# Patient Record
Sex: Female | Born: 1965 | Race: Asian | Hispanic: No | Marital: Married
Health system: Western US, Academic
[De-identification: ages and names within clinical notes are randomized; demographics above are authoritative.]

## PROBLEM LIST (undated history)

## (undated) DIAGNOSIS — D1803 Hemangioma of intra-abdominal structures: Secondary | ICD-10-CM

## (undated) DIAGNOSIS — R1013 Epigastric pain: Secondary | ICD-10-CM

## (undated) DIAGNOSIS — K146 Glossodynia: Secondary | ICD-10-CM

## (undated) HISTORY — DX: Hemangioma of intra-abdominal structures: D18.03

## (undated) HISTORY — DX: Epigastric pain: R10.13

## (undated) HISTORY — DX: Glossodynia: K14.6

---

## 2005-09-26 HISTORY — PX: ESOPHAGOGASTRODUODENOSCOPY: SHX1529

## 2005-11-15 ENCOUNTER — Ambulatory Visit: Payer: Self-pay | Admitting: Internal Medicine

## 2005-11-15 ENCOUNTER — Emergency Department (HOSPITAL_COMMUNITY): Admission: EM | Admit: 2005-11-15 | Discharge: 2005-11-15 | Payer: Self-pay | Admitting: Emergency Medicine

## 2006-09-26 HISTORY — PX: ESOPHAGOGASTRODUODENOSCOPY: SHX1529

## 2006-12-27 ENCOUNTER — Ambulatory Visit (HOSPITAL_COMMUNITY): Admission: RE | Admit: 2006-12-27 | Discharge: 2006-12-27 | Payer: Self-pay | Admitting: Family Medicine

## 2007-01-05 ENCOUNTER — Ambulatory Visit (HOSPITAL_COMMUNITY): Admission: RE | Admit: 2007-01-05 | Discharge: 2007-01-05 | Payer: Self-pay | Admitting: Family Medicine

## 2007-01-09 ENCOUNTER — Ambulatory Visit (HOSPITAL_COMMUNITY): Admission: RE | Admit: 2007-01-09 | Discharge: 2007-01-09 | Payer: Self-pay | Admitting: Family Medicine

## 2007-01-16 ENCOUNTER — Ambulatory Visit (HOSPITAL_COMMUNITY): Admission: RE | Admit: 2007-01-16 | Discharge: 2007-01-16 | Payer: Self-pay | Admitting: Family Medicine

## 2007-01-23 ENCOUNTER — Ambulatory Visit: Payer: Self-pay | Admitting: Gastroenterology

## 2007-01-31 ENCOUNTER — Encounter (INDEPENDENT_AMBULATORY_CARE_PROVIDER_SITE_OTHER): Payer: Self-pay | Admitting: Specialist

## 2007-01-31 ENCOUNTER — Ambulatory Visit (HOSPITAL_COMMUNITY): Admission: RE | Admit: 2007-01-31 | Discharge: 2007-01-31 | Payer: Self-pay | Admitting: Gastroenterology

## 2007-01-31 ENCOUNTER — Ambulatory Visit: Payer: Self-pay | Admitting: Gastroenterology

## 2007-03-07 ENCOUNTER — Ambulatory Visit: Payer: Self-pay | Admitting: Gastroenterology

## 2007-05-02 ENCOUNTER — Ambulatory Visit: Payer: Self-pay | Admitting: Gastroenterology

## 2007-06-06 ENCOUNTER — Other Ambulatory Visit: Admission: RE | Admit: 2007-06-06 | Discharge: 2007-06-06 | Payer: Self-pay | Admitting: Obstetrics and Gynecology

## 2007-06-21 ENCOUNTER — Ambulatory Visit: Payer: Self-pay | Admitting: Gastroenterology

## 2008-05-20 ENCOUNTER — Ambulatory Visit (HOSPITAL_COMMUNITY): Admission: RE | Admit: 2008-05-20 | Discharge: 2008-05-20 | Payer: Self-pay | Admitting: Orthopaedic Surgery

## 2008-11-30 ENCOUNTER — Emergency Department (HOSPITAL_COMMUNITY): Admission: EM | Admit: 2008-11-30 | Discharge: 2008-11-30 | Payer: Self-pay | Admitting: Emergency Medicine

## 2008-12-01 ENCOUNTER — Emergency Department (HOSPITAL_COMMUNITY): Admission: EM | Admit: 2008-12-01 | Discharge: 2008-12-01 | Payer: Self-pay | Admitting: Emergency Medicine

## 2008-12-07 ENCOUNTER — Emergency Department (HOSPITAL_COMMUNITY): Admission: EM | Admit: 2008-12-07 | Discharge: 2008-12-07 | Payer: Self-pay | Admitting: Emergency Medicine

## 2008-12-10 ENCOUNTER — Ambulatory Visit (HOSPITAL_COMMUNITY): Admission: RE | Admit: 2008-12-10 | Discharge: 2008-12-10 | Payer: Self-pay | Admitting: Family Medicine

## 2009-01-05 ENCOUNTER — Ambulatory Visit (HOSPITAL_COMMUNITY): Admission: RE | Admit: 2009-01-05 | Discharge: 2009-01-05 | Payer: Self-pay | Admitting: Family Medicine

## 2009-03-16 ENCOUNTER — Ambulatory Visit (HOSPITAL_COMMUNITY): Admission: RE | Admit: 2009-03-16 | Discharge: 2009-03-16 | Payer: Self-pay | Admitting: Family Medicine

## 2009-09-16 ENCOUNTER — Other Ambulatory Visit: Admission: RE | Admit: 2009-09-16 | Discharge: 2009-09-16 | Payer: Self-pay | Admitting: Obstetrics and Gynecology

## 2009-09-21 ENCOUNTER — Ambulatory Visit (HOSPITAL_COMMUNITY): Admission: RE | Admit: 2009-09-21 | Discharge: 2009-09-21 | Payer: Self-pay | Admitting: Obstetrics and Gynecology

## 2010-02-08 ENCOUNTER — Encounter (INDEPENDENT_AMBULATORY_CARE_PROVIDER_SITE_OTHER): Payer: Self-pay | Admitting: *Deleted

## 2010-02-24 HISTORY — PX: ESOPHAGOGASTRODUODENOSCOPY: SHX1529

## 2010-02-24 HISTORY — PX: COLONOSCOPY: SHX174

## 2010-02-26 DIAGNOSIS — R142 Eructation: Secondary | ICD-10-CM

## 2010-02-26 DIAGNOSIS — R141 Gas pain: Secondary | ICD-10-CM

## 2010-02-26 DIAGNOSIS — K2970 Gastritis, unspecified, without bleeding: Secondary | ICD-10-CM

## 2010-02-26 DIAGNOSIS — K2990 Gastroduodenitis, unspecified, without bleeding: Secondary | ICD-10-CM

## 2010-02-26 DIAGNOSIS — R143 Flatulence: Secondary | ICD-10-CM

## 2010-03-02 ENCOUNTER — Ambulatory Visit: Payer: Self-pay | Admitting: Gastroenterology

## 2010-03-02 ENCOUNTER — Encounter: Payer: Self-pay | Admitting: Gastroenterology

## 2010-03-02 DIAGNOSIS — D1803 Hemangioma of intra-abdominal structures: Secondary | ICD-10-CM

## 2010-03-02 DIAGNOSIS — R1013 Epigastric pain: Secondary | ICD-10-CM

## 2010-03-02 DIAGNOSIS — K219 Gastro-esophageal reflux disease without esophagitis: Secondary | ICD-10-CM

## 2010-03-02 DIAGNOSIS — K921 Melena: Secondary | ICD-10-CM | POA: Insufficient documentation

## 2010-03-03 ENCOUNTER — Ambulatory Visit (HOSPITAL_COMMUNITY): Admission: RE | Admit: 2010-03-03 | Discharge: 2010-03-03 | Payer: Self-pay | Admitting: Gastroenterology

## 2010-03-05 ENCOUNTER — Encounter: Payer: Self-pay | Admitting: Gastroenterology

## 2010-03-15 ENCOUNTER — Ambulatory Visit: Payer: Self-pay | Admitting: Gastroenterology

## 2010-03-15 ENCOUNTER — Ambulatory Visit (HOSPITAL_COMMUNITY)
Admission: RE | Admit: 2010-03-15 | Discharge: 2010-03-15 | Payer: Self-pay | Source: Home / Self Care | Admitting: Gastroenterology

## 2010-03-18 ENCOUNTER — Encounter (INDEPENDENT_AMBULATORY_CARE_PROVIDER_SITE_OTHER): Payer: Self-pay

## 2010-05-07 ENCOUNTER — Encounter: Payer: Self-pay | Admitting: Gastroenterology

## 2010-06-03 ENCOUNTER — Ambulatory Visit: Payer: Self-pay | Admitting: Gastroenterology

## 2010-07-12 IMAGING — US US ABDOMEN COMPLETE
1 series · 14 of 25 positions shown · non-contrast
Comparison: 01/16/2007 ultrasound, CT 01/05/2007

CLINICAL DATA: Epigastric abdominal pain.

COMPLETE ABDOMINAL ULTRASOUND

[Series 1: us abdomen complete · 0.28mm/px · 14 of 67 slices shown]
[im 1/67]
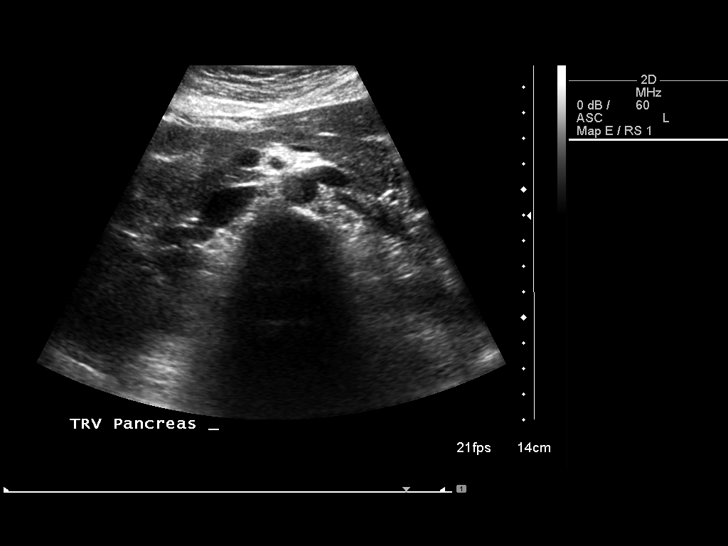
[im 6/67]
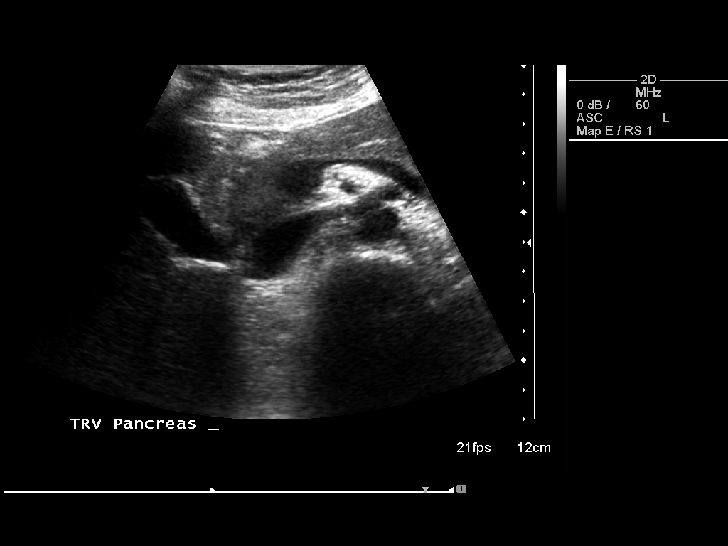
[im 12/67]
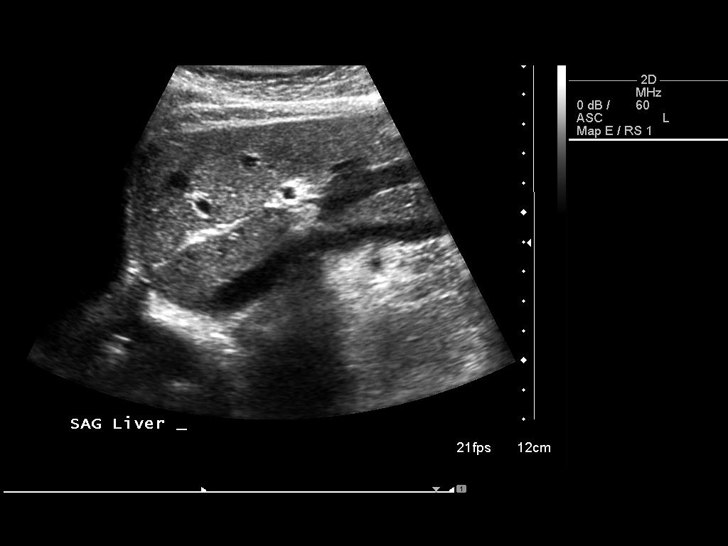
[im 17/67]
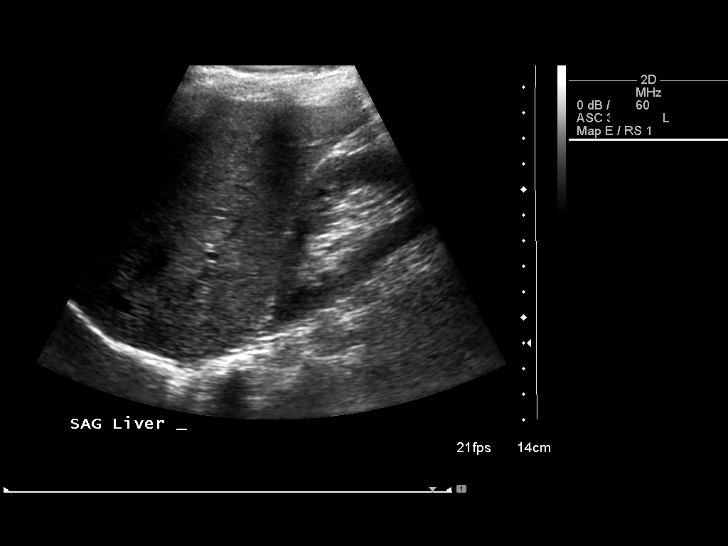
[im 23/67]
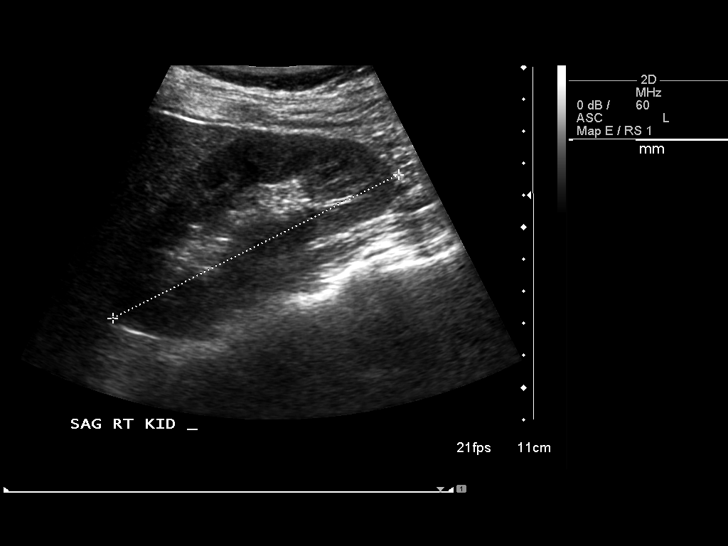
[im 25/67]
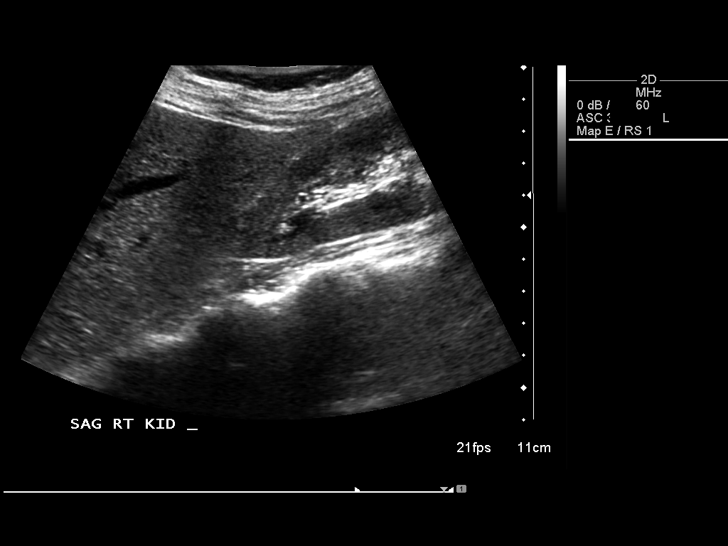
[im 31/67]
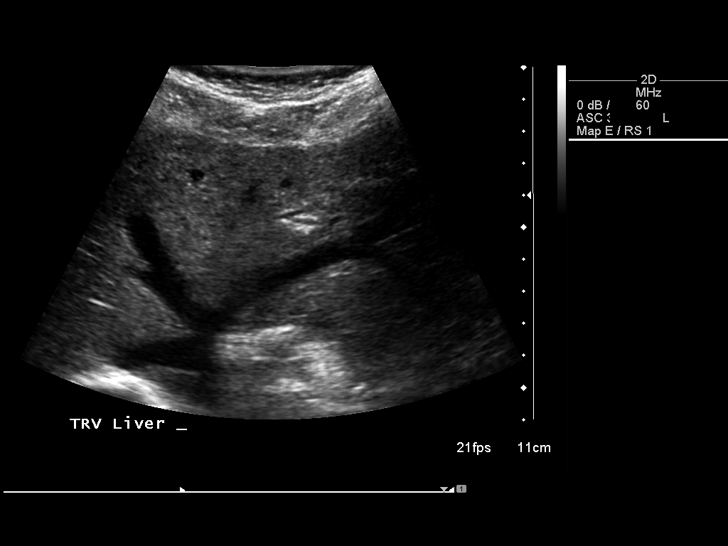
[im 36/67]
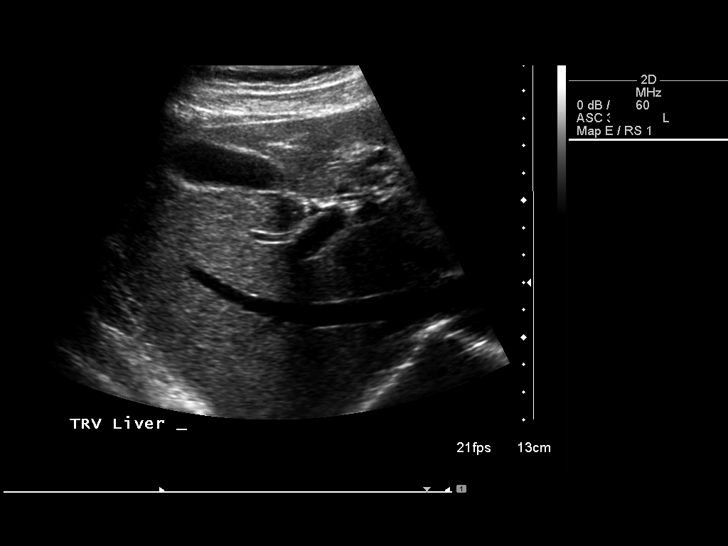
[im 42/67]
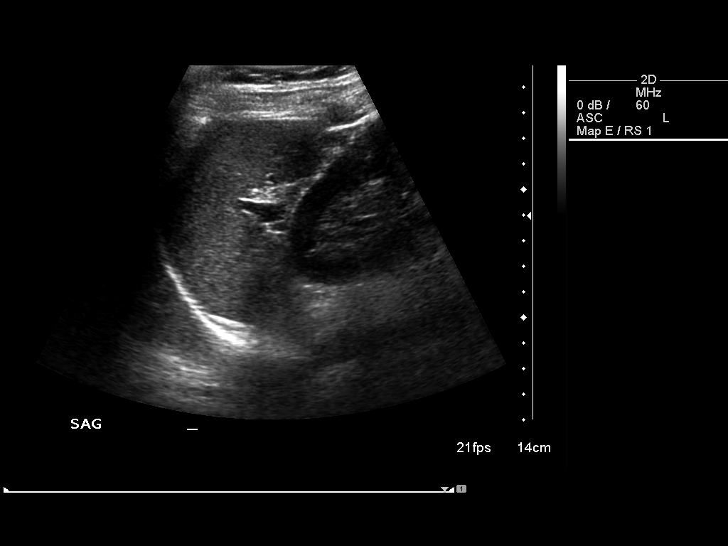
[im 45/67]
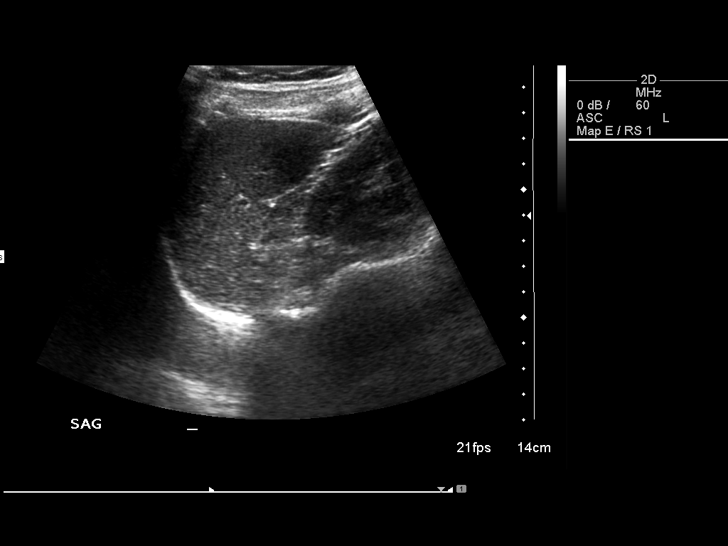
[im 50/67]
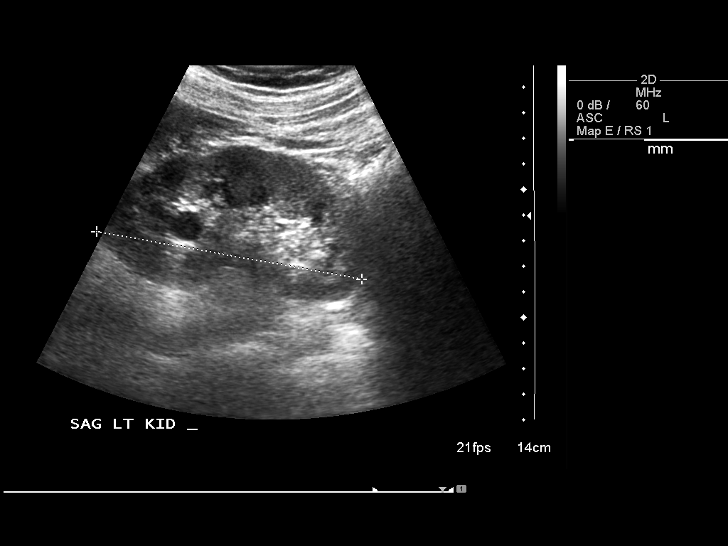
[im 56/67]
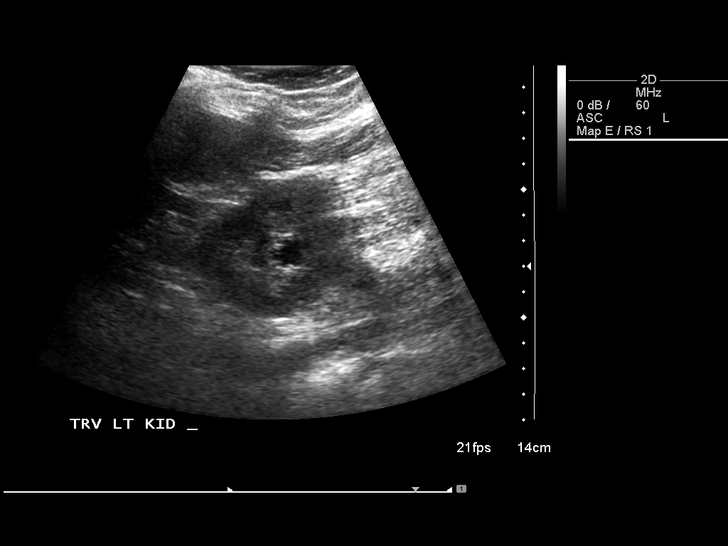
[im 61/67]
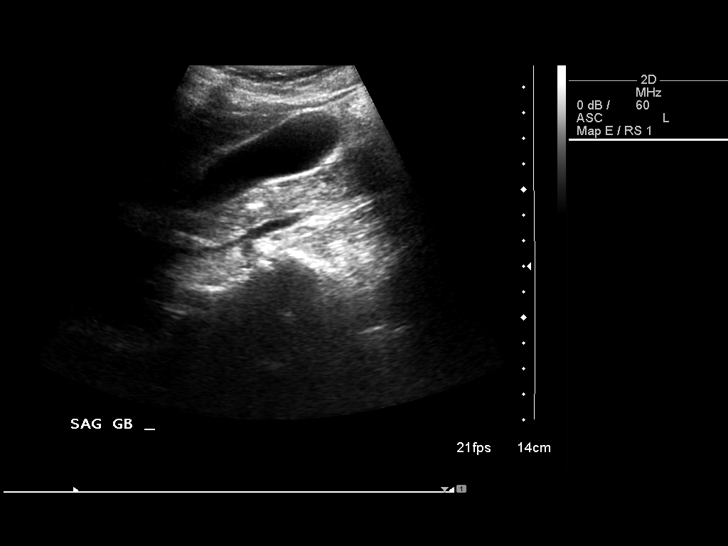
[im 67/67]
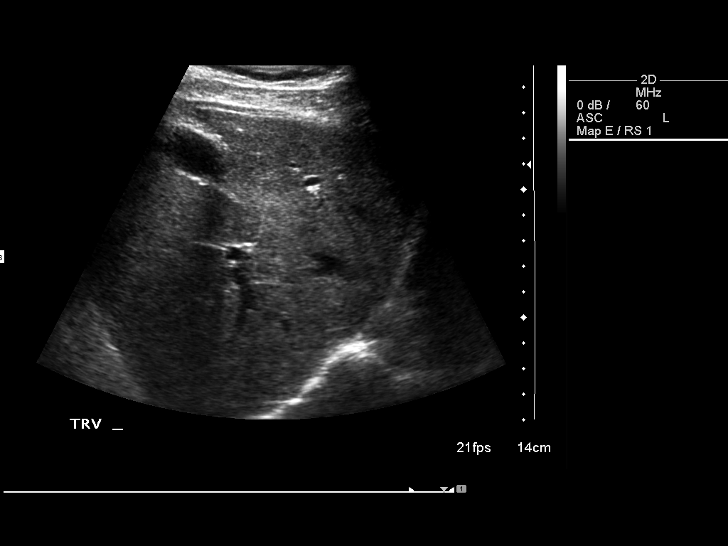

[14 of 25 positions shown; findings below may reference images not displayed]

FINDINGS: The gallbladder, common duct, liver, intrahepatic IVC, spleen,
right kidney, and abdominal aorta are normal.

A 1.3 x 1.3 x 1.2 cm left upper renal pole simple appearing cyst is
again noted.
IMPRESSION: No acute intra-abdominal finding.

## 2010-09-08 ENCOUNTER — Ambulatory Visit (HOSPITAL_COMMUNITY)
Admission: RE | Admit: 2010-09-08 | Discharge: 2010-09-08 | Payer: Self-pay | Source: Home / Self Care | Attending: Urology | Admitting: Urology

## 2010-10-28 NOTE — Letter (Signed)
Summary: TCS/EGD ORDER  TCS/EGD ORDER   Imported By: Ave Filter 03/02/2010 12:48:29  _____________________________________________________________________  External Attachment:    Type:   Image     Comment:   External Document

## 2010-10-28 NOTE — Assessment & Plan Note (Signed)
Summary: RECTAL BLEED RECURRENT/SS   Visit Type:  Initial Consult Referring Provider:  Fusco Primary Care Provider:  McGough  Chief Complaint:  rectal bleeding.  History of Present Illness: Barbara [name] is a pleasant 45 y/o [place] female who presents for further evaluation of recurrent rectal bleeding and epigastric pain. She c/o intermittent brbpr for quite some time but over the last one month it has been more frequent and increased volume. She describes at fresh blood. She has intermittent hard stools. Recently started on lactulose which helps when she takes it. She c/o ongoing epigastric pain, worse with meals and in am. She admits to eating at 10pm at night and then going to bed. She denies heartburn but c/o waking up with bad taste in her mouth and epigastric pain. No n/v. No dysphagia. She is really concerned about having cancer. She has h/o liver lesion found in 2008 and was supposed to have f/u MRI in 6 months but this was never done.   Current Medications (verified): 1)  No Meds  Allergies (verified): No Known Drug Allergies  Past History:  Past Medical History: EGD 5/08-->mild gastritis, no H.Pylori EGD, 2/07, emergent--fishbone removed from hypopharynx Nonspecific, 8X38mm homogeneously enhancing lesion within posterior dome of right liver on MRI 4/08  Past Surgical History: None  Family History: Father, cancer Brother, lung cancer Mother, healthy  Social History: Married. One son. Originally from [place]. In [place] since 1983. No tob/etoh. [employer].   Review of Systems General:  Denies fever, chills, sweats, anorexia, fatigue, weakness, and weight loss. Eyes:  Denies vision loss. ENT:  Denies nasal congestion, sore throat, hoarseness, and difficulty swallowing. CV:  Denies chest pains, angina, palpitations, dyspnea on exertion, and peripheral edema. Resp:  Denies dyspnea at rest, dyspnea with exercise, and cough. GI:  See HPI. GU:  Denies urinary burning and blood  in urine. MS:  Denies joint pain / LOM. Derm:  Denies rash and itching. Neuro:  Denies weakness, frequent headaches, memory loss, and confusion. Psych:  Denies depression and anxiety. Endo:  Denies unusual weight change. Heme:  Denies bruising and bleeding. Allergy:  Denies hives and rash.  Vital Signs:  Patient profile:   45 year old female Height:      62 inches Weight:      105 pounds BMI:     19.27 Temp:     98.9 degrees F oral Pulse rate:   84 / minute BP sitting:   110 / 78  (left arm) Cuff size:   regular  Vitals Entered By: Cloria Spring LPN (March 02, 1609 8:26 AM)  Physical Exam  General:  Well developed, well nourished, no acute distress. Head:  Normocephalic and atraumatic. Eyes:  sclera nonicteric Mouth:  Oropharyngeal mucosa moist, pink.  No lesions, erythema or exudate.    Neck:  Supple; no masses or thyromegaly. Lungs:  Clear throughout to auscultation. Heart:  Regular rate and rhythm; no murmurs, rubs,  or bruits. Abdomen:  Soft. Positive bs. Mild epigastric tenderness. No rebound or guarding. No HSM or masses. No abd bruit or hernia. Rectal:  deferred until time of colonoscopy.   Extremities:  No clubbing, cyanosis, edema or deformities noted. Neurologic:  Alert and  oriented x4;  grossly normal neurologically. Skin:  Intact without significant lesions or rashes. Cervical Nodes:  No significant cervical adenopathy. Psych:  Alert and cooperative. Normal mood and affect.  Impression & Recommendations:  Problem # 1:  HEMATOCHEZIA (ICD-578.1)  Chronic intermittent hematachezia. Ddx includes hemorrhoidal bleeding, polyps,  avms, malignancy. Colonoscopy to be performed in near future.  Risks, alternatives, and benefits including but not limited to the risk of reaction to medication, bleeding, infection, and perforation were addressed.  Patient voiced understanding and provided verbal consent.   Orders: Consultation Level III ([address])  Problem # 2:  EPIGASTRIC  PAIN, CHRONIC (ICD-789.06)  Chronic pp epigastric pain. Previously some improvement with PPI. Last EGD, nonspecific gastritis. Will resume PPI. Nexium 40mg  by mouth daily, #20 samples. Patient very persistent on having repeat EGD. EGD to be performed in near future.  Risks, alternatives, benefits including but not limited to risk of reaction to medications, bleeding, infection, and perforation addressed.  Patient voiced understanding and verbal consent obtained.   Orders: Consultation Level III ([address])  Problem # 3:  LIVER MASS (ICD-235.3)  Overdue for surveillance MRI liver with/without contrast.   Orders: Consultation Level III (09811) I would like to thank Dr. Sherwood Gambler for allowing [place] to take part in the care of this nice patient.  Appended Document: RECTAL BLEED RECURRENT/SS MAY 2011 13 LBS

## 2010-10-28 NOTE — Letter (Signed)
Summary: UROLOGY REFERRAL  UROLOGY REFERRAL   Imported By: Ave Filter 03/05/2010 09:07:53  _____________________________________________________________________  External Attachment:    Type:   Image     Comment:   External Document  Appended Document: UROLOGY REFERRAL Pt scheduled to see the Urologist 03/22/10@9 :30a.m.  Pt aware of appt.

## 2010-10-28 NOTE — Assessment & Plan Note (Signed)
Summary: GASTRITIS, HEMANGIOMA   Visit Type:  Follow-up Visit Primary Care Provider:  Regino Schultze, M.D.  Chief Complaint:  follow up- doing ok.  History of Present Illness: Feels better. Sometime foods causes it to feel hard. Sometimes she eats and it feels hard and full.   Current Medications (verified): 1)  Pantoprazole Sodium 40 Mg Tbec (Pantoprazole Sodium) .... Once Daily  Allergies (verified): No Known Drug Allergies  Past History:  Social History: Last updated: 06/03/2010 Married. One son: grade 12.  Originally from [place].  In [place] since 1983. No tob/etoh. [employer].   Past Medical History: 2011 RECTAL BLEEDING **[facility]-S[facility] DYSPEPSIA EGD 2011: [facility] HH, FUNDIC GLAND POLYP, EGD 5/08-->mild gastritis, no H.Pylori, EGD, 2/07, emergent--fishbone removed from hypopharynx SMALL HEPATIC HEMANGIOMA **MRI 2011, 2008: POS DOME RIGHT LIVER  Past Surgical History: Reviewed history from 03/02/2010 and no changes required. None  Social History: Married. One son: grade 12.  Originally from [place].  In [place] since 1983. No tob/etoh. [employer].   Vital Signs:  Patient profile:   45 year old female Height:      62 inches Weight:      106 pounds BMI:     19.46 Temp:     98.0 degrees F oral Pulse rate:   76 / minute BP sitting:   118 / 80  (left arm) Cuff size:   regular  Vitals Entered By: Hendricks Limes LPN (June 03, 2010 8:29 AM)  Physical Exam  General:  Well developed, well nourished, no acute distress. Head:  Normocephalic and atraumatic. Lungs:  Clear throughout to auscultation. Heart:  Regular rate and rhythm; no murmurs. Abdomen:  Soft. Positive bs. Mild epigastric tenderness. No rebound or guarding.   Impression & Recommendations:  Problem # 1:  GASTRITIS (ICD-535.50) Assessment Improved Continue Protonix. Eat 4-6 small meals daily. OPV IN 6 MOS.  CC: PCP  Appended Document: GASTRITIS, HEMANGIOMA 6 MONTH F/U OPV IS IN THE COMPUTER  Appended  Document: Orders Update    Clinical Lists Changes  Orders: Added new Service order of Est. Patient Level II ([address]) - Signed

## 2010-10-28 NOTE — Letter (Signed)
Summary: Patient Notice, Endo Biopsy Results  [facility]  [address]   [place], [place] [address]   Phone: [phone]  Fax: [phone]       March 18, 2010   Barbara [name] [address] [place], [place]  [address] 1966-07-22    Dear Ms. Irigoyen,  I am pleased to inform you that the biopsies taken during your recent endoscopic examination did not show any evidence of cancer upon pathologic examination.  Additional information/recommendations:    __Please continue to take your reflux medication..      Please call us if you are having persistent problems or have questions about your condition that have not been fully answered at this time.  Sincerely,    Hendricks Limes LPN  [facility] Ph: [phone]   Fax: [phone]

## 2010-10-28 NOTE — Letter (Signed)
Summary: referral note  referral note   Imported By: Rosine Beat 05/07/2010 13:26:56  _____________________________________________________________________  External Attachment:    Type:   Image     Comment:   External Document

## 2010-10-28 NOTE — Miscellaneous (Signed)
Summary: Orders Update  Clinical Lists Changes  Problems: Added new problem of SCREENING FOR UNSPECIFIED CONDITION (ICD-V82.9) Orders: Added new Test order of T-Creatinine Blood ([id number]) - Signed 

## 2010-10-28 NOTE — Letter (Signed)
Summary: Scheduled Appointment  [facility]  [address]   [place], [place] [address]   Phone: [phone]  Fax: [phone]    Feb 08, 2010   Dear: Barbara [name]            DOB: 01-22-1966    I have been instructed to schedule you an appointment in our office.  Your appointment is as follows:   Date:    March 02, 2010    Time:    8:30 AM    Please be here 15 minutes early.   Provider:   Unice Bailey    Please contact the office if you need to reschedule this appointment for a more convenient time.   Thank you,    Diana Eves       [facility] Ph: [phone]   Fax: [phone]

## 2010-10-28 NOTE — Letter (Signed)
Summary: MRI ORDER  MRI ORDER   Imported By: Ave Filter 03/02/2010 12:49:17  _____________________________________________________________________  External Attachment:    Type:   Image     Comment:   External Document

## 2010-12-02 ENCOUNTER — Encounter (INDEPENDENT_AMBULATORY_CARE_PROVIDER_SITE_OTHER): Payer: Self-pay | Admitting: *Deleted

## 2010-12-07 LAB — CREATININE, SERUM
Creatinine, Ser: 0.59 mg/dL (ref 0.4–1.2)
GFR calc Af Amer: >60 mL/min (ref >60)

## 2010-12-07 NOTE — Letter (Signed)
Summary: Recall Office Visit  [facility]  [address]   [place], [place] [address]   Phone: [phone]  Fax: [phone]      December 02, 2010   Barbara [name] [address] [place], [place]  [address] 03-19-1966   Dear Ms. Kitts,   According to our records, it is time for you to schedule a follow-up office visit with [place].   At your convenience, please call [phone] to schedule an office visit. If you have any questions, concerns, or feel that this letter is in error, we would appreciate your call.   Sincerely,    Diana Eves  [facility] Ph: [phone]   Fax: [phone]

## 2011-02-08 NOTE — Assessment & Plan Note (Signed)
NAMEMarland Kitchen  [name], Barbara [name]                    CHART#:  [id number]   DATE:  06/21/2007                       DOB:  12-27-1965   REFERRING PHYSICIAN:  Karleen Hampshire, MD.   PROBLEM LIST:  1. 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  2. Mild chronic gastritis.   SUBJECTIVE:  Barbara [name] is a 45 year old female who presents in a return  patient visit. She states that things are much better. the Digestive  Advantage made her stomach hurt after 3 pills and so she stopped it. She  denies any nausea, vomiting or abdominal pain since stopping the  Digestive Advantage. Her appetite is okay. She easily gets tired. She  works 13 hours a day and gets 6 to 7 hours of sleep. She has a 14-year-  old child she describes as a good boy. She still feels like her abdomen  looks full, but it does not hurt. She has been avoiding milk.   MEDICATIONS:  Aciphex 20 mg daily.   PHYSICAL EXAMINATION:  VITAL SIGNS: Weight is 99.5 pounds, height 5 feet  2 inches (BMI 18.1, underweight. Weight stable since August of 2008).  Temperature 97.8, blood pressure 100/70, pulse 72.GENERAL: In general  she is in no apparent distress.LUNGS: The lungs are clear to  auscultation bilaterally.CARDIOVASCULAR: Her exam is a regular rhythm,  no murmur, normal S1 and S2.ABDOMEN: Bowel sounds are present, soft,  nontender, nondistended. No rebound or guarding.   LABORATORY DATA:  Labs from April of 2008: Total bilirubin 1.5, indirect  1.3, direct is 0.2. Normal GGT, AST and ALT.   ASSESSMENT:  Barbara [name] is a 45 year old female who presents as a return  patient visit. Her abdominal pain is now resolved on Aciphex. She also  had an elevated bilirubin which is predominantly indirect. 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 I explained to her that that condition is not  clinically significant.   Thank you for allowing me to see Barbara [name] in consultation. My  recommendations follow.    RECOMMENDATIONS:  1. She should continue the Aciphex until her refills run out. She may      try going without it, but if      her symptoms persist, then she would return to using the Aciphex.  2. She may follow up to see me as needed.       Kassie Mends, M.D.  Electronically Signed     SM/MEDQ  D:  06/21/2007  T:  06/21/2007  Job:  [id number]   cc:   Kirk Ruths, M.D.

## 2011-02-08 NOTE — Assessment & Plan Note (Signed)
NAME:  KIARRAH, RAUSCH                    CHART#:  [id number]   DATE:  05/02/2007                       DOB:  May 19, 1966   REFERRING PHYSICIAN:  Assunta Found.   PROBLEM LIST:  1. 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  2. Mild chronic gastritis.   SUBJECTIVE:  Ms. Boys is a 45 year old female who had improvement in  her bloating with the addition of Align.  She was unable to find it in  the store and is currently requesting additional pills.  She is still  taking the Aciphex and sometimes has pain and sometimes not.  She is not  really feeling full as much.  If she does eat something, occasionally  she will feel bloated.  In the daytime she does not feel the bloating as  much, it is usually when she wakes up in the morning.   MEDICATIONS:  Aciphex daily.   OBJECTIVE:  Weight 99 pounds (down 3 pounds since June of 2008), height  5 feet 2 inches,  BMI: 18.1 (underweight), temperature 98.5, blood  pressure 100/70, pulse 72.  GENERAL:  She is in no apparent distress, alert and oriented x4.LUNGS:  Clear to auscultation bilaterally.CARDIOVASCULAR EXAM:  A regular  rhythm, no murmur, normal S1 and S2.ABDOMEN:  Bowel sounds are present,  soft, nontender, nondistended, no rebound or guarding.   ASSESSMENT:  Ms. 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  The differential diagnosis includes lactose intolerance.  She  could have bacterial overgrowth.   Thank you for allowing me to see Ms. Kochanowski in consultation.  My  recommendations follow.   RECOMMENDATIONS:  1. She is asked to follow a lactose-free diet.  She may also take      Digestive Advantage Lactose Intolerance 1 or 2 daily for      inadvertent consumption of dairy products.  2. She is to continue the Aciphex once every day or every other day to      see if her abdominal pain is still controlled.  The Aciphex has an      expensive co-pay.  She is given an Aciphex co-  pay reimbursement card.  3. She has a return patient visit to see me in 6 weeks.       Kassie Mends, M.D.  Electronically Signed     SM/MEDQ  D:  05/03/2007  T:  05/04/2007  Job:  [id number]   cc:   Corrie Mckusick, M.D.

## 2011-02-08 NOTE — Assessment & Plan Note (Signed)
NAMESINDHU, Barbara [name]                    CHART#:  [id number]   DATE:  03/07/2007                       DOB:  August 23, 1966   PROBLEM LIST:  1. Abdominal pain and bloating, likely secondary to functional gut      disorder.  2. Mild chronic gastritis.  3. Hemangioma, Right hepatic lobe   SUBJECTIVE:  Ms. Barbara [name] is a 45 year old female who presents as a return  patient visit.  She reports feeling better, but not 100%.  She still  has a feeling of fullness.  She does not feel sick.  She has a normal  bowel movement.  She occasionally has hard stool.  She denies any watery  stool.  She states that after she eats it feels big in her belly.  It is  usually worse in the morning.  She gets up and walks around, and does  not feel as bad.  She denies chewing any gum or drinking any soda.  She  states she just drinks water.  She drinks a cup of regular milk every  morning.  She has been doing that for a long time.  She has some mild  abdominal pain.   MEDICATIONS:  Aciphex 20 mg daily.   OBJECTIVE:  Weight 102 pounds (down 2 pounds since her last visit).  Height 5 feet 2 inches.  BMI 18.7 (slightly underweight).  Temperature  98.6.  Blood pressure 100/78.  Pulse 80.  GENERAL:  She is in no apparent distress.  Alert and oriented x4.  LUNGS:  Clear to auscultation bilaterally.CARDIOVASCULAR EXAM:  Regular  rhythm.  No murmur.  Normal S1 and S2.ABDOMEN:  Bowel sounds are  present.  Soft, non-tender, and non-distended.  No rebound or guarding.   ASSESSMENT:  Ms. Barbara [name] is a 45 year old female with a complaint of  persistent bloating.  The differential diagnosis includes lactose  intolerance, bacterial overgrowth, and functional gut disorder.   Thank you for allowing me to see Ms. Barbara [name] in consultation.  My  recommendations to follow.   RECOMMENDATIONS:  1. Ms. Barbara [name] was asked to cut out drinking a cup of milk every morning      for 1 week.  If her abdominal discomfort is not improved, then she  is to begin to take Align.  She is to take that once a day for 3      weeks.  She is to call me on April 05, 2007 and let me know if her      symptoms are not improved.  If her symptoms are not improved, then      she will get a gastric emptying study.  She was given her      instructions for the management of her bloating in writing.  2. She has a return patient visit to see me in 6 weeks.  3. She should continue the Aciphex daily.       Kassie Mends, M.D.  Electronically Signed     SM/MEDQ  D:  03/08/2007  T:  03/08/2007  Job:  [id number]   cc:   Corrie Mckusick, M.D.

## 2011-02-11 NOTE — Op Note (Signed)
Barbara [name], Barbara [name]                   ACCOUNT NO.:  [id number]   MEDICAL RECORD NO.:  [id number]          PATIENT TYPE:  EMS   LOCATION:  ED                            FACILITY:  [facility]   PHYSICIAN:  R. Roetta Sessions, M.D. DATE OF BIRTH:  [birth year]   DATE OF PROCEDURE:  11/15/2005  DATE OF DISCHARGE:                                 OPERATIVE REPORT   PROCEDURE:  Emergency esophagogastroduodenoscopy with removal of foreign  body (fishbone).   ENDOSCOPIST:  Gerrit Friends. Rourk, M.D.   INDICATIONS FOR PROCEDURE:  The patient is a 45 year old [place] female,  resident of [place] who ate some fish last time evening and felt a fish  bone caught in the base of her throat.  She has had pain on swallowing ever  since.  She presented to the emergency department and saw Dr. Gertie Fey.  Plain  films reportedly were negative.  She called me, I have seen [name] and  offered her an emergent EGD.  The potential risks, benefits, and  alternatives have been reviewed, questions answered.  She is agreeable.  Please see documentation in the medical record.   PROCEDURE NOTE:  O2 saturation, blood pressure, pulse and respirations were  monitored throughout the entire procedure.   CONSCIOUS SEDATION:  IV Versed and Demerol in incremental doses.  Cetacaine  spray for topical oropharyngeal anesthesia.   INSTRUMENT:  Olympus video chip system.   FINDINGS:  Examination of the hypopharynx revealed a 2 cm long, nearly  transparent fishbone behind the epiglottis lodged in the mucosa.  Please see  photos. This was grasped with the tripod and removed. Subsequently we went  back and looked around and did not see any further foreign body.  Examination of the esophagus revealed no mucosal abnormalities.  The EG  junction was easily traversed.   STOMACH:  The gastric cavity was empty.  It insufflated well with air.  A  thorough examination of the gastric mucosa including a retroflex view of the  proximal stomach and  esophagogastric junction demonstrated only a small  hiatal hernia.  The pylorus was patent and easily traversed. Examination of  the bulb and second portion revealed no abnormalities.   The patient tolerated the brief procedure very well and was reacted in  endoscopy.   IMPRESSION:  1.  Foreign body (fishbone) removed from hypopharynx as described above.  2.  Otherwise normal hypopharynx, normal esophagus and small hiatal hernia.  3.  Otherwise normal stomach and D1-D2.   RECOMMENDATIONS:  Chloraseptic p.r.n. for transient sore throat that she may  experience.      Jonathon Bellows, M.D.  Electronically Signed     RMR/MEDQ  D:  11/15/2005  T:  11/16/2005  Job:  [id number]   cc:   Nickola Major, M.D.

## 2011-02-11 NOTE — Op Note (Signed)
Barbara [name], Barbara [name]                   ACCOUNT NO.:  [id number]   MEDICAL RECORD NO.:  [id number]          PATIENT TYPE:  AMB   LOCATION:  DAY                           FACILITY:  [facility]   PHYSICIAN:  Kassie Mends, M.D.      DATE OF BIRTH:  30-Dec-1965   DATE OF PROCEDURE:  01/31/2007  DATE OF DISCHARGE:                               OPERATIVE REPORT   PROCEDURE:  Esophagogastroduodenoscopy with cold forceps biopsy.   INDICATIONS FOR EXAM:  Barbara [name] is a 45 year old female who presented  with abdominal pain, weight loss, and bloating.  Her EGD is being  performed to evaluate for abdominal pain and weight loss.   FINDINGS:  1. Patchy erythema in the antrum.  Biopsies obtained to evaluate for      Helicobacter pylori gastritis or eosinophilic gastritis.  2. Normal esophagus, without evidence of Barrett's, erosion, or      ulceration.  3. Normal duodenal bulb and second portion of the duodenum.   RECOMMENDATIONS:  1. We will call Barbara [name] with her biopsy report.  2. Continue AcipHex.  Prescription given for AcipHex 20 mg daily, #30,      refill x3.  3. Resume previous diet, but avoid gastric irritants.  She is given      information on gastritis.  4. Avoid aspirin, NSAIDs, or anticoagulation for 5 days.  5. Follow up with Dr. Cira Servant in one month.   PROCEDURE TECHNIQUE:  Physical exams were performed, and informed  consent was obtained from the patient after explaining the benefits,  risks, and alternatives to the procedure.  The patient was connected to  the monitor and placed in the left lateral position.  Continuous oxygen  was provided by nasal cannula, and IV medicine administered through an  indwelling cannula.  After administration of sedation, the patient's  esophagus was intubated, and the scope was advanced under direct  visualization to the second portion of the duodenum.  The scope was  subsequently removed while carefully examining the color, texture,  anatomy, and  integrity of the mucosa on the way out.  The patient was  recovered in the endoscopy suite and discharged home in satisfactory  condition.      Kassie Mends, M.D.  Electronically Signed    SM/MEDQ  D:  01/31/2007  T:  01/31/2007  Job:  [id number]   cc:   Corrie Mckusick, M.D.  Fax: [phone]

## 2011-02-11 NOTE — Consult Note (Signed)
NAMEMOXIE, KALIL                   ACCOUNT NO.:  [id number]   MEDICAL RECORD NO.:  [id number]          PATIENT TYPE:  AMB   LOCATION:                                FACILITY:  [facility]   PHYSICIAN:  Kassie Mends, M.D.      DATE OF BIRTH:  23-Jun-1966   DATE OF CONSULTATION:  01/23/2007  DATE OF DISCHARGE:                                 CONSULTATION   REFERRING PHYSICIAN:  Corrie Mckusick, M.D.   REASON FOR CONSULTATION:  Abdominal pain, weight loss, and bloating.   HISTORY OF PRESENT ILLNESS:  Ms. Lacap is a 45 year old female who began  to have abdominal pain and bloating on April 2.  She noticed it when she  was eating shrimp.  She saw Dr. Regino Schultze and she was placed on Nexium.  Even with Nexium she continues to have bloating and fullness in her  upper abdomen.  She saw Dr. Phillips Odor and he put her on AcipHex.  She  describes it as a fullness all the time.  She is sometimes nauseated.  She also describes it as feeling like throwing up.  She also had some  black, tarry stools last year.  AcipHex has made her feel better but she  still has persistent pain.  She has also lost 4 pounds since the  beginning of April.  She denies any use of aspirin, ibuprofen, Aleve,  Goody Powders or BCs.  She is scared she might have cancer.  She has had  a workup by her family physician, which has included a CT scan and an  MRI and lab evaluation.  When she had her  CT scan, she reports feeling  big.  When she had the  MRI, she also felt big.  Today she feels big as  well.  She still remains uncomfortable.  She denies any bright red blood  from her rectum.  She denied any vomiting or problems swallowing.  She  has a bowel movement every day.  She denies any diarrhea.   PAST MEDICAL HISTORY:  None.   PAST SURGICAL HISTORY:  None.   ALLERGIES:  No known drug allergies.   MEDICATIONS:  AcipHex 20 mg daily.   FAMILY HISTORY:  Her father had cancer but she is not sure what type.  She reports a brother had  lung cancer.  Her mother is healthy.   SOCIAL HISTORY:  She is married and has one son.  They plan to travel  back to [place].  She is employed at [employer].  She is originally  [place].  She has been in the [place] since  1983.  She has lived in [place] as well as the [place] area.  She  denies any tobacco or alcohol use.   REVIEW OF SYSTEMS:  Per the HPI.  Otherwise, all systems are negative.   PHYSICAL EXAMINATION:  VITAL SIGNS:  Weight 104 pounds, height 5 feet 2  inches, temperature 98, blood pressure 110/70, pulse 64.  GENERAL:  She is in no apparent distress, alert and oriented  x4.  HEENT:  Atraumatic, normocephalic.  Pupils equal and react to light.  Mouth:  No  oral lesions.  Posterior pharynx without erythema or exudate.  NECK:  Full range of motion.  No lymphadenopathy.  LUNGS:  Clear to  auscultation bilaterally.  CARDIOVASCULAR:  A regular rhythm, no murmur,  normal S1 and S2.  ABDOMEN:  Bowel sounds present, soft, mild tenderness  to palpation in the epigastrium, epigastrium somewhat firm, no palpable  masses, no hepatosplenomegaly, abdominal bruits, or pulsatile masses.  EXTREMITIES:  Without cyanosis, clubbing or edema.  NEUROLOGIC:  No  focal neurologic deficits:   LABORATORY DATA:  April 2,2008:  Hemoglobin 15.2, platelets 200.  Cholesterol 238.  Hepatitis A antibody IgM negative.  Hepatitis C  antibody negative.  Labs from April17,2008:  Total bilirubin 1.5,  indirect bilirubin 1.3, alkaline phosphatase 37, AST 10, ALT 11, GGT 12.  Lipase 11.  Helicobacter pylori antibody 0.6.   RADIOGRAPHIC STUDIES:  CT scan of the abdomen AND pelvis with contrast  performed on January 05, 2007:  Peripheral enhancing low-density mass in  the left ovary, hyperdensity along the dome of liver, likely hemangioma.  MRI recommended.   MRI of the abdomen with and without contrast performed on January 09, 2007:  Nonspecific 8 x 9 mm homogeneously-enhancing  lesion within the  posterior dome of the liver, likely a hemangioma but is strictly  indeterminate.  Recommend MR or CT follow-up in 6-12 months.  Bilateral  renal cysts.   ASSESSMENT:  Ms. Hornbaker is a 45 year old female with nausea, epigastric  pain and weight loss which is likely secondary to gastritis, but the  differential diagnosis includes gastric malignancy in this young woman  of Asian descent.  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  She has an indeterminate lesion in the  right lobe of her liver, which is likely a hemangioma.  Thank you for  allowing me to see Ms. Mogensen in consultation.  My recommendations  follow.   RECOMMENDATIONS:  1. She is scheduled for an EGD and likely will have biopsies on May      7,2008 at 8:30.  She should continue her AcipHex.  2. She has a follow-up appointment to see me in 1 month.  3. Agree with follow-up imaging in 6-12 months for the lesion in the      right lobe or her liver.  4. She needs no additional workup for her mildly elevated indirect      bilirubin.  5. We will make further recommendations for her abdominal pain once      her upper endoscopy is complete.      Kassie Mends, M.D.  Electronically Signed     SM/MEDQ  D:  01/23/2007  T:  01/23/2007  Job:  [id number]   cc:   Corrie Mckusick, M.D.  Fax: [phone]   Kirk Ruths, M.D.  Fax: [phone]

## 2011-05-31 ENCOUNTER — Encounter: Payer: Self-pay | Admitting: Gastroenterology

## 2011-05-31 ENCOUNTER — Ambulatory Visit (INDEPENDENT_AMBULATORY_CARE_PROVIDER_SITE_OTHER): Payer: Self-pay | Admitting: Gastroenterology

## 2011-05-31 VITALS — BP 112/76 | HR 74 | Temp 97.2°F | Ht 62.0 in | Wt 108.2 lb

## 2011-05-31 DIAGNOSIS — D376 Neoplasm of uncertain behavior of liver, gallbladder and bile ducts: Secondary | ICD-10-CM

## 2011-05-31 DIAGNOSIS — K2970 Gastritis, unspecified, without bleeding: Secondary | ICD-10-CM

## 2011-05-31 DIAGNOSIS — K2990 Gastroduodenitis, unspecified, without bleeding: Secondary | ICD-10-CM

## 2011-05-31 MED ORDER — RABEPRAZOLE SODIUM 20 MG PO TBEC: 20.0000 mg | DELAYED_RELEASE_TABLET | Freq: Every day | ORAL | Status: DC

## 2011-05-31 MED ORDER — ESOMEPRAZOLE MAGNESIUM 40 MG PO CPDR: DELAYED_RELEASE_CAPSULE | ORAL | Status: DC

## 2011-05-31 NOTE — Patient Instructions (Signed)
Please stop pantoprazole. Begin Aciphex. I have given you samples of Nexium to use when not taking Aciphex since they seem to work better for you (but not covered by your insurance. Please call with persistent problems. Office visit in 6 months with Dr. Darrick Penna.

## 2011-05-31 NOTE — Assessment & Plan Note (Signed)
Dyspepsia/h/o gastritis. Doing better. Thinks Nexium and Aciphex better than pantoprazole. Wants to try again. OV in six months with Dr. Darrick Penna.

## 2011-05-31 NOTE — Progress Notes (Signed)
Primary Care Physician: Kirk Ruths, MD  Primary Gastroenterologist:  Jonette Eva, MD   Chief Complaint  Patient presents with  . Medication Refill  . Follow-up    1 yr    HPI: Barbara [name] is a 45 y.o. female here for f/u dyspepsia. Overall doing better. As long as takes pantoprazole, feels okay. Recently took some old Nexium samples which seemed to be better. States insurance would not pay for it and even with approval her copay was too high. Like Aciphex before, wants to try it again. No heartburn, vomiting, diarrhea, constipation, brbpr, melena.C/O abd bloating at times.   Current Outpatient Prescriptions  Medication Sig Dispense Refill  . esomeprazole (NEXIUM) 40 MG capsule To take only when not taking aciphex.  15 capsule  0  . RABEprazole (ACIPHEX) 20 MG tablet Take 1 tablet (20 mg total) by mouth daily.  30 tablet  11    Allergies as of 05/31/2011  . (No Known Allergies)    ROS:  General: Negative for anorexia, weight loss, fever, chills, fatigue, weakness. ENT: Negative for hoarseness, difficulty swallowing , nasal congestion. CV: Negative for chest pain, angina, palpitations, dyspnea on exertion, peripheral edema.  Respiratory: Negative for dyspnea at rest, dyspnea on exertion, cough, sputum, wheezing.  GI: See history of present illness. GU:  Negative for dysuria, hematuria, urinary incontinence, urinary frequency, nocturnal urination.  Endo: Negative for unusual weight change.    Physical Examination:   BP 112/76  Pulse 74  Temp(Src) 97.2 F (36.2 C) (Temporal)  Ht 5\' 2"  (1.575 m)  Wt 108 lb 3.2 oz (49.079 kg)  BMI 19.79 kg/m2  General: Well-nourished, well-developed in no acute distress.  Eyes: No icterus. Mouth: Oropharyngeal mucosa moist and pink , no lesions erythema or exudate. Lungs: Clear to auscultation bilaterally.  Heart: Regular rate and rhythm, no murmurs rubs or gallops.  Abdomen: Bowel sounds are normal, nontender, nondistended, no  hepatosplenomegaly or masses, no abdominal bruits or hernia , no rebound or guarding.   Extremities: No lower extremity edema. No clubbing or deformities. Neuro: Alert and oriented x 4   Skin: Warm and dry, no jaundice.   Psych: Alert and cooperative, normal mood and affect.

## 2011-06-01 NOTE — Progress Notes (Signed)
Cc to PCP 

## 2011-06-02 NOTE — Progress Notes (Signed)
agree

## 2011-06-03 ENCOUNTER — Telehealth: Payer: Self-pay

## 2011-06-03 NOTE — Telephone Encounter (Signed)
Pt called and said that the Aciphex is not covered by her insurance. She wants to go back to Pantoprazole, said she only pays $10:00 for that prescription. Aciphex works a little the best, but she cannot afford it. She is aware this will not be taken care of today.

## 2011-06-08 ENCOUNTER — Other Ambulatory Visit: Payer: Self-pay | Admitting: Gastroenterology

## 2011-06-08 MED ORDER — PANTOPRAZOLE SODIUM 40 MG PO TBEC: 40.0000 mg | DELAYED_RELEASE_TABLET | Freq: Every day | ORAL | Status: DC

## 2011-06-08 NOTE — Telephone Encounter (Signed)
See what insurance will pay for. She like Nexium best. If have to go back to pantoprazole we can.

## 2011-06-08 NOTE — Telephone Encounter (Signed)
Pt said she could not afford the Aciphex or the Nexium. She did not get either.  She would like to get the Pantoprazole, that is the only one she can afford.

## 2011-06-08 NOTE — Telephone Encounter (Signed)
LSL gave pt 2 PPI RX at last OV. Please see if pt needs this RX or is using one of the others? Thanks

## 2011-06-09 NOTE — Telephone Encounter (Signed)
Routing to Julie.  

## 2011-06-15 NOTE — Telephone Encounter (Signed)
pts insurance will pay for omeprazole, lansoprazole and pantoprazole.

## 2011-06-16 MED ORDER — PANTOPRAZOLE SODIUM 40 MG PO TBEC: 40.0000 mg | DELAYED_RELEASE_TABLET | Freq: Every day | ORAL | Status: DC

## 2011-06-16 NOTE — Telephone Encounter (Signed)
RX done. Pt should let us know if continues to have problems.

## 2011-06-20 NOTE — Telephone Encounter (Signed)
LMOM that prescription has been sent. Let us know if continues to have problems.

## 2011-08-24 ENCOUNTER — Encounter: Payer: Self-pay | Admitting: Gastroenterology

## 2011-08-24 ENCOUNTER — Ambulatory Visit (INDEPENDENT_AMBULATORY_CARE_PROVIDER_SITE_OTHER): Payer: BC Managed Care – PPO | Admitting: Gastroenterology

## 2011-08-24 VITALS — BP 117/78 | HR 75 | Temp 97.0°F | Ht 63.0 in | Wt 111.8 lb

## 2011-08-24 DIAGNOSIS — K146 Glossodynia: Secondary | ICD-10-CM | POA: Insufficient documentation

## 2011-08-24 HISTORY — DX: Glossodynia: K14.6

## 2011-08-24 MED ORDER — NYSTATIN 100000 UNIT/ML MT SUSP: 500000.0000 [IU] | Freq: Four times a day (QID) | OROMUCOSAL | Status: AC

## 2011-08-24 NOTE — Progress Notes (Signed)
  Subjective:    Patient ID: Barbara [name], female    DOB: 02/03/1966, 45 y.o.   MRN: [id number]  PCP: [facility]  HPI  Initially seen in 2008 for abd pain and bloating. She weighted 104 lbs.   PT C/O OF BURNING TONGUE SINCE SEP 2012. Has started new meds within the last year: 1. MVI, 2. CHINESE MEDICINE 12 MOS AGO. ONLY HAS MILD ABDOMINAL PAIN 1-2X/MO. No heartburn or indigestions. Sx not better with Aciphex or Nexium. DRINKS OJ REGULARlY. NO CAFFEINE OR TEA. DOESN'T BRUSH HER TONGUE AND NO NEW TOOTHPASTE. Doesn't drink milk. Sx were off and on and now daily.  NO SPICY FOODS OR DRY MOUTH. She stopped the [place] medicine 2 weeks ago. Early on she had white spots in her mouth. Has a regular menstrual cycle.  Past Medical History  Diagnosis Date  . Dyspepsia   . Hepatic hemangioma     Past Surgical History  Procedure Date  . Esophagogastroduodenoscopy 2007    emergent, fishbone removed from hypopharynx  . Esophagogastroduodenoscopy 2008    mild gastritis, no h.Pylori  . Esophagogastroduodenoscopy 02/2010    small hh, fundic gland polyp  . Colonoscopy 02/2010    small internal hemorrhoids, normal TI   No Known Allergies  Current Outpatient Prescriptions  Medication Sig Dispense Refill  . esomeprazole (NEXIUM) 40 MG capsule To take only when not taking aciphex.    .         Review of Systems  All other systems reviewed and are negative.       Objective:   Physical Exam  Vitals reviewed. Constitutional: She is oriented to person, place, and time. She appears well-developed and well-nourished. No distress.  HENT:  Head: Normocephalic.  Mouth/Throat: Oropharynx is clear and moist. No oropharyngeal exudate.       No buccal lesions, no masses, nl tongue  Eyes: Pupils are equal, round, and reactive to light. No scleral icterus.  Neck: Normal range of motion. Neck supple.  Cardiovascular: Normal rate, regular rhythm and normal heart sounds.   Pulmonary/Chest: Effort normal and  breath sounds normal. No respiratory distress.  Abdominal: Soft. Bowel sounds are normal. She exhibits no distension. There is no tenderness.  Musculoskeletal: Normal range of motion. She exhibits no edema.  Lymphadenopathy:    She has no cervical adenopathy.  Neurological: She is alert and oriented to person, place, and time.       NO FOCAL DEFICITS   Psychiatric: She has a normal mood and affect.          Assessment & Plan:

## 2011-08-24 NOTE — Assessment & Plan Note (Addendum)
STOP USING THE CHINESE MEDICINE, MVI, AND ORANGE JUICE. AVOID CAFFEINE, TEA, AND MOUTHWASH WHILE TAKING THE NYSTATIN. USE NYSTATIN SWISH IN YOUR MOUTH AND SPIT OUT FOUR TIMES A DAY FOR 12 DAYS. FOLLOW UP IN 1 MONTH. Consider testing for vitamin deficiency if Sx not improved: iron, zinc, folate (vitamin B-9), thiamin (vitamin B-1), riboflavin (vitamin B-2), pyridoxine (vitamin B-6) and cobalamin (vitamin B-12).  CONTINUE NEXIUM. I WILL MAKE AN APPT FOR YOU TO BE SEEN AT [facility] IN 6 WEEKS.

## 2011-08-24 NOTE — Patient Instructions (Addendum)
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  STOP USING THE CHINESE MEDICINE, MVI, AND ORANGE JUICE. AVOID CAFFEINE AND MOUTHWASH. USE NYSTATIN SWISH IN YOUR MOUTH AND SPIT OUT FOUR TIMES A DAY FOR 12 DAYS. FOLLOW UP IN 1 MONTH. I WILL MAKE AN APPT FOR YOU TO BE SEEN AT [facility] IN 6 WEEKS.

## 2011-08-24 NOTE — Progress Notes (Signed)
Cc to PCP 

## 2011-08-25 ENCOUNTER — Telehealth: Payer: Self-pay | Admitting: Gastroenterology

## 2011-08-25 NOTE — Telephone Encounter (Signed)
Pt is scheduled to see Dr Georgia Dom @ [facility] on 10/10/11 @9 :30- they will mail pt info packet

## 2011-08-29 NOTE — Progress Notes (Signed)
Pt is aware of OV on 12/20 at 0900 with SF

## 2011-08-31 ENCOUNTER — Ambulatory Visit: Payer: BC Managed Care – PPO | Admitting: Gastroenterology

## 2011-09-15 ENCOUNTER — Encounter: Payer: Self-pay | Admitting: Gastroenterology

## 2011-09-15 ENCOUNTER — Ambulatory Visit (INDEPENDENT_AMBULATORY_CARE_PROVIDER_SITE_OTHER): Payer: BC Managed Care – PPO | Admitting: Gastroenterology

## 2011-09-15 DIAGNOSIS — R1013 Epigastric pain: Secondary | ICD-10-CM

## 2011-09-15 DIAGNOSIS — K146 Glossodynia: Secondary | ICD-10-CM

## 2011-09-15 NOTE — Progress Notes (Signed)
  Subjective:    Patient ID: Barbara [name], female    DOB: [birth year], 45 y.o.   MRN: [id number]  PCP:   HPI Mouth is much better. Still a little burning ON TONGUE. Tongue is not hot. Nexium once a day.  FEEL LIKE THERE'S A BAD TASTE IN MOUTH-JUST A LITTLE BIT.    Review of Systems     Objective:   Physical Exam        Assessment & Plan:

## 2011-09-15 NOTE — Assessment & Plan Note (Addendum)
LIKELY DUE TO GASTRITIS. PT HAS RECTUS SHEATH DEFECT V. ? HERNIA.  OPV IN 6 MOS. CONTINUE NEXIUM. TAKE PROBIOTIC DAILY.

## 2011-09-15 NOTE — Assessment & Plan Note (Signed)
Sx RESOLVING.  CONTINUE NEXIUM. FOLLOW UP IN 3 MOS. CAnCEL APPT AT [facility] pt does not want to drive that far and would prefer eval in [place].

## 2011-09-15 NOTE — Patient Instructions (Signed)
Take a probiotic daily. You may use ALIGN DAILY, which may cost $40 per month. An alternative would be [employer] brand probiotic. Continue Nexium once daily. FOLLOW UP IN 3 MOS.

## 2011-09-15 NOTE — Progress Notes (Signed)
Cc to PCP 

## 2011-09-26 ENCOUNTER — Encounter: Payer: Self-pay | Admitting: Gastroenterology

## 2011-09-26 NOTE — Progress Notes (Signed)
11m f/u appt scheduled

## 2011-10-06 ENCOUNTER — Encounter: Payer: Self-pay | Admitting: Gastroenterology

## 2011-12-21 ENCOUNTER — Ambulatory Visit (INDEPENDENT_AMBULATORY_CARE_PROVIDER_SITE_OTHER): Payer: BC Managed Care – PPO | Admitting: Gastroenterology

## 2011-12-21 ENCOUNTER — Encounter: Payer: Self-pay | Admitting: Gastroenterology

## 2011-12-21 VITALS — BP 117/76 | HR 84 | Temp 98.1°F | Ht 62.0 in | Wt 111.4 lb

## 2011-12-21 DIAGNOSIS — R142 Eructation: Secondary | ICD-10-CM

## 2011-12-21 DIAGNOSIS — K2970 Gastritis, unspecified, without bleeding: Secondary | ICD-10-CM

## 2011-12-21 DIAGNOSIS — R141 Gas pain: Secondary | ICD-10-CM

## 2011-12-21 MED ORDER — ESOMEPRAZOLE MAGNESIUM 40 MG PO CPDR: DELAYED_RELEASE_CAPSULE | ORAL | Status: DC

## 2011-12-21 NOTE — Progress Notes (Signed)
  Subjective:    Patient ID: Barbara [name], female    DOB: 1966/05/25, 46 y.o.   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 Burning sensation in her mouth is gone. May feel it sometimes, but not a lot. No heartburn, indigestion, but can feel uncomfortable with eating nuts or beans: fullness. Ned Rx for Nexium.   Past Medical History  Diagnosis Date  . Dyspepsia   . Hepatic hemangioma     Past Surgical History  Procedure Date  . Esophagogastroduodenoscopy 2007    emergent, fishbone removed from hypopharynx  . Esophagogastroduodenoscopy 2008    mild gastritis, no h.Pylori  . Esophagogastroduodenoscopy 02/2010    small hh, fundic gland polyp  . Colonoscopy 02/2010    small internal hemorrhoids, normal TI    No Known Allergies  Current Outpatient Prescriptions  Medication Sig Dispense Refill  . esomeprazole (NEXIUM) 40 MG capsule To take only when not taking aciphex.        Review of Systems     Objective:   Physical Exam  Vitals reviewed. Constitutional: She is oriented to person, place, and time. She appears well-nourished. No distress.  HENT:  Head: Normocephalic and atraumatic.  Neck: Normal range of motion. Neck supple.  Cardiovascular: Normal rate, regular rhythm and normal heart sounds.   Pulmonary/Chest: Effort normal and breath sounds normal. No respiratory distress.  Abdominal: Soft. Bowel sounds are normal. She exhibits no distension. There is no tenderness.  Neurological: She is alert and oriented to person, place, and time.       NO FOCAL DEFICITS   Psychiatric: She has a normal mood and affect.          Assessment & Plan:

## 2011-12-21 NOTE — Progress Notes (Signed)
Faxed to PCP

## 2011-12-21 NOTE — Assessment & Plan Note (Signed)
Sx controlled.  CONTINUE NEXIUM. YOU HAVE REFILLS FOR ONE YEAR. FOLLOW UP IN 6 MOS.

## 2011-12-21 NOTE — Patient Instructions (Signed)
CONTINUE NEXIUM. YOU HAVE REFILLS FOR ONE YEAR.  USE A PROBIOTIC DAILY ([employer]S BRAND, [employer]S [employer]ON HEALTH, OR ALIGN).  FOLLOW UP IN 6 MOS.

## 2011-12-21 NOTE — Assessment & Plan Note (Signed)
With nuts & beans.  USE A PROBIOTIC DAILY ([employer] BRAND, PH[employer]S [employer]ON HEALTH, OR ALIGN). FOLLOW UP IN 6 MOS.

## 2011-12-22 ENCOUNTER — Ambulatory Visit: Payer: BC Managed Care – PPO | Admitting: Gastroenterology

## 2011-12-28 NOTE — Progress Notes (Signed)
Reminder in epic to follow up in 6 months with SF in E30 °

## 2012-04-26 ENCOUNTER — Other Ambulatory Visit: Payer: Self-pay | Admitting: Obstetrics and Gynecology

## 2012-04-26 DIAGNOSIS — Z139 Encounter for screening, unspecified: Secondary | ICD-10-CM

## 2012-04-27 ENCOUNTER — Ambulatory Visit (HOSPITAL_COMMUNITY)
Admission: RE | Admit: 2012-04-27 | Discharge: 2012-04-27 | Disposition: A | Discharge status: Home / Self Care | Payer: BC Managed Care – PPO | Source: Ambulatory Visit | Attending: Obstetrics and Gynecology | Admitting: Obstetrics and Gynecology

## 2012-04-27 DIAGNOSIS — Z1231 Encounter for screening mammogram for malignant neoplasm of breast: Secondary | ICD-10-CM | POA: Insufficient documentation

## 2012-04-27 DIAGNOSIS — Z139 Encounter for screening, unspecified: Secondary | ICD-10-CM

## 2012-05-09 ENCOUNTER — Other Ambulatory Visit: Payer: Self-pay | Admitting: Obstetrics and Gynecology

## 2012-05-09 ENCOUNTER — Other Ambulatory Visit (HOSPITAL_COMMUNITY)
Admission: RE | Admit: 2012-05-09 | Discharge: 2012-05-09 | Disposition: A | Discharge status: Home / Self Care | Payer: BC Managed Care – PPO | Source: Ambulatory Visit | Attending: Obstetrics and Gynecology | Admitting: Obstetrics and Gynecology

## 2012-05-09 DIAGNOSIS — Z01419 Encounter for gynecological examination (general) (routine) without abnormal findings: Secondary | ICD-10-CM | POA: Insufficient documentation

## 2012-06-04 ENCOUNTER — Encounter: Payer: Self-pay | Admitting: Gastroenterology

## 2012-08-22 ENCOUNTER — Encounter: Payer: Self-pay | Admitting: Gastroenterology

## 2012-08-22 ENCOUNTER — Ambulatory Visit (INDEPENDENT_AMBULATORY_CARE_PROVIDER_SITE_OTHER): Payer: BC Managed Care – PPO | Admitting: Gastroenterology

## 2012-08-22 VITALS — BP 121/78 | HR 84 | Temp 97.6°F | Ht 60.0 in | Wt 110.6 lb

## 2012-08-22 DIAGNOSIS — R1013 Epigastric pain: Secondary | ICD-10-CM | POA: Insufficient documentation

## 2012-08-22 DIAGNOSIS — R141 Gas pain: Secondary | ICD-10-CM

## 2012-08-22 LAB — HEPATIC FUNCTION PANEL
ALT: 10 U/L (ref 0–35)
AST: 10 U/L (ref 0–37)
Albumin: 4.6 g/dL (ref 3.5–5.2)
Alkaline Phosphatase: 44 U/L (ref 39–117)
Total Bilirubin: 0.9 mg/dL (ref 0.3–1.2)
Total Protein: 7.2 g/dL (ref 6.0–8.3)

## 2012-08-22 LAB — CBC WITH DIFFERENTIAL/PLATELET
Basophils Absolute: 0 10*3/uL (ref 0.0–0.1)
Basophils Relative: 1 % (ref 0–1)
Eosinophils Absolute: 0.1 10*3/uL (ref 0.0–0.7)
Eosinophils Relative: 1 % (ref 0–5)
HCT: 41.3 % (ref 36.0–46.0)
Hemoglobin: 14.3 g/dL (ref 12.0–15.0)
MCH: 31.6 pg (ref 26.0–34.0)
MCHC: 34.6 g/dL (ref 30.0–36.0)
Monocytes Absolute: 0.3 10*3/uL (ref 0.1–1.0)
Monocytes Relative: 7 % (ref 3–12)
Neutro Abs: 2.6 10*3/uL (ref 1.7–7.7)
RDW: 13 % (ref 11.5–15.5)

## 2012-08-22 NOTE — Progress Notes (Signed)
Primary Care Physician: Kirk Ruths, MD  Primary Gastroenterologist:  Jonette Eva, MD    Chief Complaint  Patient presents with  . Abdominal Pain    HPI: Barbara [name] is a 46 y.o. female here with one week history of epigastric pain. Similar to episodes in the past but seems to be more severe. She was last seen in our office in 11/2011. She states she had been doing well until about one week ago. Pain is present most of the time. No n/v. No heartburn. Bloating. Pain a little worse with meals, hurts more if eats bigger portions.  BM every day, dark brown. No brbpr. No weight loss. Last month not consistent with Nexium. Skipped several doses each week. Denies NSAIDs/ASA.  Current Outpatient Prescriptions  Medication Sig Dispense Refill  . esomeprazole (NEXIUM) 40 MG capsule To take only when not taking aciphex.  31 capsule  11    Allergies as of 08/22/2012  . (No Known Allergies)    ROS:  General: Negative for anorexia, weight loss, fever, chills, fatigue, weakness. ENT: Negative for hoarseness, difficulty swallowing , nasal congestion. CV: Negative for chest pain, angina, palpitations, dyspnea on exertion, peripheral edema.  Respiratory: Negative for dyspnea at rest, dyspnea on exertion, cough, sputum, wheezing.  GI: See history of present illness. GU:  Negative for dysuria, hematuria, urinary incontinence, urinary frequency, nocturnal urination.  Endo: Negative for unusual weight change.    Physical Examination:   BP 121/78  Pulse 84  Temp 97.6 F (36.4 C) (Temporal)  Ht 5' (1.524 m)  Wt 110 lb 9.6 oz (50.168 kg)  BMI 21.60 kg/m2  LMP 08/15/2012  General: Well-nourished, well-developed in no acute distress.  Eyes: No icterus. Mouth: Oropharyngeal mucosa moist and pink , no lesions erythema or exudate. Lungs: Clear to auscultation bilaterally.  Heart: Regular rate and rhythm, no murmurs rubs or gallops.  Abdomen: Bowel sounds are normal, mild epigastric tenderness  and mild distention, no hepatosplenomegaly or masses, no abdominal bruits or hernia , no rebound or guarding.   Rectal: Small amount of hard stool, brown, heme negative. No masses or hemorrhoids noted. Nontender exam. Extremities: No lower extremity edema. No clubbing or deformities. Neuro: Alert and oriented x 4   Skin: Warm and dry, no jaundice.   Psych: Alert and cooperative, normal mood and affect.

## 2012-08-22 NOTE — Assessment & Plan Note (Signed)
Epigastric pain and abdominal bloating over the past couple of weeks. Not consistent with taking Nexium as she typically has been. Wonders if symptoms caused by the amount of nuts she is eating. I suspect we are dealing with dyspepsia. We will check for gallbladder disease with labs, abd u/s. She will increase her Nexium to BID for two weeks and then go back to once daily. Cut back on nut consumption. Probiotic for four weeks, Align #10 provided.   If symptoms do not settle down and w/u unremarkable, we may consider EGD.

## 2012-08-22 NOTE — Progress Notes (Signed)
Faxed to PCP

## 2012-08-22 NOTE — Patient Instructions (Signed)
Please have your blood work done.  Please have your ultrasound done to evaluate your pain.  Increase Nexium to twice a day before breakfast and evening meal for next two weeks. We have provided you with samples. Then go back to once daily Nexium.  Take a probiotic for next four weeks. You can buy without a prescription. We have given you Align to take once daily to get started.  Please cut back on the nuts until you feel better.

## 2012-08-28 ENCOUNTER — Ambulatory Visit (HOSPITAL_COMMUNITY)
Admission: RE | Admit: 2012-08-28 | Discharge: 2012-08-28 | Disposition: A | Discharge status: Home / Self Care | Payer: BC Managed Care – PPO | Source: Ambulatory Visit | Attending: Internal Medicine | Admitting: Internal Medicine

## 2012-08-28 DIAGNOSIS — R142 Eructation: Secondary | ICD-10-CM

## 2012-08-28 DIAGNOSIS — N281 Cyst of kidney, acquired: Secondary | ICD-10-CM | POA: Insufficient documentation

## 2012-08-28 DIAGNOSIS — R1013 Epigastric pain: Secondary | ICD-10-CM

## 2012-08-28 NOTE — Progress Notes (Signed)
Quick Note:  Please let patient know her labs are normal. Await abd u/s, scheduled for today. ______

## 2012-08-29 NOTE — Progress Notes (Signed)
Quick Note:  Called and informed pt. ______ 

## 2012-08-31 NOTE — Progress Notes (Signed)
Quick Note:  Please let patient know her gallbladder looks good. No worrisome findings on u/s. Plan as outlined in OV note. She should call in 2 weeks with progress report. ______

## 2012-08-31 NOTE — Progress Notes (Signed)
Quick Note:  Called and informed pt. She will call with progress report in a couple of weeks, earlier if needed. She requests a copy of the [place] report and the labs. Putting in the mail for her. ______

## 2012-09-10 NOTE — Progress Notes (Addendum)
ABD U/S-WNLS. PT MOST LIKELY HAS NON-ULCER DYSPEPSIA OR EPIGASTRIC PAIN SYNDROME. LAST EGD JUN 2011-GASTRITIS(MILD). OPV IN 2 MOS W/ SLF E30. CONSIDER EGD IF Sx NOT IMPROVED.  REVIEWED.

## 2012-09-11 NOTE — Progress Notes (Signed)
Doris, please see Dr. Darrick Penna recommendations. Needs OV 2 months with SLF E30.

## 2012-09-11 NOTE — Progress Notes (Signed)
Sent staff message to Darl Pikes to schedule OV with SF in 2 months.

## 2012-09-11 NOTE — Progress Notes (Signed)
Called and informed pt.  

## 2012-09-21 ENCOUNTER — Encounter: Payer: Self-pay | Admitting: Gastroenterology

## 2012-09-21 NOTE — Progress Notes (Signed)
Pt is aware of OV on 2/5 at 1030 with SF in E30 and appt card was mailed

## 2012-10-31 ENCOUNTER — Ambulatory Visit: Payer: BC Managed Care – PPO | Admitting: Gastroenterology

## 2012-11-28 ENCOUNTER — Ambulatory Visit (INDEPENDENT_AMBULATORY_CARE_PROVIDER_SITE_OTHER): Payer: BC Managed Care – PPO | Admitting: Gastroenterology

## 2012-11-28 ENCOUNTER — Encounter: Payer: Self-pay | Admitting: Gastroenterology

## 2012-11-28 VITALS — BP 116/79 | HR 80 | Temp 97.2°F | Ht 63.0 in | Wt 111.6 lb

## 2012-11-28 MED ORDER — ESOMEPRAZOLE MAGNESIUM 40 MG PO CPDR: DELAYED_RELEASE_CAPSULE | ORAL | Status: DC

## 2012-11-28 NOTE — Assessment & Plan Note (Signed)
SX CONTROLLED.  CONTINUE NEXIUM. OPV 12 MOS.

## 2012-11-28 NOTE — Progress Notes (Signed)
Faxed to PCP

## 2012-11-28 NOTE — Progress Notes (Signed)
  Subjective:    Patient ID: Barbara [name], female    DOB: 12/21/1965, 47 y.o.   MRN: [id number]  PCP: [facility]  HPI FEELS OKAY. NO CONCERNS. BMS: DAILY. FEELS ALIGN MAKES HER FEEL MORE CONSTIPATED.  PT DENIES FEVER, CHILLS, BRBPR, nausea, vomiting, melena, diarrhea, constipation, abd pain, problems swallowing, heartburn or indigestion.  Past Medical History  Diagnosis Date  . Dyspepsia   . Hepatic hemangioma     stable on 02/2010 MRI, no further w/u needed   Past Surgical History  Procedure Laterality Date  . Esophagogastroduodenoscopy  2007    emergent, fishbone removed from hypopharynx  . Esophagogastroduodenoscopy  2008    mild gastritis, no h.Pylori  . Esophagogastroduodenoscopy  02/2010    small hh, fundic gland polyp  . Colonoscopy  02/2010    small internal hemorrhoids, normal TI   No Known Allergies  Current Outpatient Prescriptions  Medication Sig Dispense Refill  . esomeprazole (NEXIUM) 40 MG capsule To take only when not taking aciphex.        Review of Systems     Objective:   Physical Exam  Vitals reviewed. Constitutional: She appears well-nourished. No distress.  HENT:  Head: Normocephalic and atraumatic.  Mouth/Throat: Oropharynx is clear and moist. No oropharyngeal exudate.  Eyes: Pupils are equal, round, and reactive to light. No scleral icterus.  Neck: Normal range of motion. Neck supple.  Cardiovascular: Normal rate, regular rhythm and normal heart sounds.   Pulmonary/Chest: Effort normal and breath sounds normal. No respiratory distress. She exhibits no tenderness.  Abdominal: Soft. Bowel sounds are normal. She exhibits no distension. There is no tenderness.  Musculoskeletal: She exhibits edema (TRACE).  Lymphadenopathy:    She has no cervical adenopathy.  Psychiatric: She has a normal mood and affect.          Assessment & Plan:

## 2012-11-28 NOTE — Patient Instructions (Signed)
CONTINUE NEXIUM.  FOLLOW A LOW FAT/HIGH FIBER DIET. SEE INFO BELOW.  FOLLOW UP IN 12 MOS.  Low-Fat Diet BREADS, CEREALS, PASTA, RICE, DRIED PEAS, AND BEANS These products are high in carbohydrates and most are low in fat. Therefore, they can be increased in the diet as substitutes for fatty foods. They too, however, contain calories and should not be eaten in excess. Cereals can be eaten for snacks as well as for breakfast.   FRUITS AND VEGETABLES It is good to eat fruits and vegetables. Besides being sources of fiber, both are rich in vitamins and some minerals. They help you get the daily allowances of these nutrients. Fruits and vegetables can be used for snacks and desserts.  MEATS Limit lean meat, chicken, [place], and fish to no more than 6 ounces per day. Beef, Pork, and Lamb Use lean cuts of beef, pork, and lamb. Lean cuts include:  Extra-lean ground beef.  Arm roast.  Sirloin tip.  Center-cut ham.  Round steak.  Loin chops.  Rump roast.  Tenderloin.  Trim all fat off the outside of meats before cooking. It is not necessary to severely decrease the intake of red meat, but lean choices should be made. Lean meat is rich in protein and contains a highly absorbable form of iron. Premenopausal women, in particular, should avoid reducing lean red meat because this could increase the risk for low red blood cells (iron-deficiency anemia).  Chicken and [place] These are good sources of protein. The fat of poultry can be reduced by removing the skin and underlying fat layers before cooking. Chicken and [place] can be substituted for lean red meat in the diet. Poultry should not be fried or covered with high-fat sauces. Fish and Shellfish Fish is a good source of protein. Shellfish contain cholesterol, but they usually are low in saturated fatty acids. The preparation of fish is important. Like chicken and [place], they should not be fried or covered with high-fat sauces. EGGS Egg whites  contain no fat or cholesterol. They can be eaten often. Try 1 to 2 egg whites instead of whole eggs in recipes or use egg substitutes that do not contain yolk. MILK AND DAIRY PRODUCTS Use skim or 1% milk instead of 2% or whole milk. Decrease whole milk, natural, and processed cheeses. Use nonfat or low-fat (2%) cottage cheese or low-fat cheeses made from vegetable oils. Choose nonfat or low-fat (1 to 2%) yogurt. Experiment with evaporated skim milk in recipes that call for heavy cream. Substitute low-fat yogurt or low-fat cottage cheese for sour cream in dips and salad dressings. Have at least 2 servings of low-fat dairy products, such as 2 glasses of skim (or 1%) milk each day to help get your daily calcium intake. FATS AND OILS Reduce the total intake of fats, especially saturated fat. Butterfat, lard, and beef fats are high in saturated fat and cholesterol. These should be avoided as much as possible. Vegetable fats do not contain cholesterol, but certain vegetable fats, such as coconut oil, palm oil, and palm kernel oil are very high in saturated fats. These should be limited. These fats are often used in bakery goods, processed foods, popcorn, oils, and nondairy creamers. Vegetable shortenings and some peanut butters contain hydrogenated oils, which are also saturated fats. Read the labels on these foods and check for saturated vegetable oils. Unsaturated vegetable oils and fats do not raise blood cholesterol. However, they should be limited because they are fats and are high in calories. Total fat should  still be limited to 30% of your daily caloric intake. Desirable liquid vegetable oils are corn oil, cottonseed oil, olive oil, canola oil, safflower oil, soybean oil, and sunflower oil. Peanut oil is not as good, but small amounts are acceptable. Buy a heart-healthy tub margarine that has no partially hydrogenated oils in the ingredients. Mayonnaise and salad dressings often are made from unsaturated  fats, but they should also be limited because of their high calorie and fat content. Seeds, nuts, peanut butter, olives, and avocados are high in fat, but the fat is mainly the unsaturated type. These foods should be limited mainly to avoid excess calories and fat. OTHER EATING TIPS Snacks  Most sweets should be limited as snacks. They tend to be rich in calories and fats, and their caloric content outweighs their nutritional value. Some good choices in snacks are graham crackers, melba toast, soda crackers, bagels (no egg), English muffins, fruits, and vegetables. These snacks are preferable to snack crackers, [place] fries, TORTILLA CHIPS, and POTATO chips. Popcorn should be air-popped or cooked in small amounts of liquid vegetable oil. Desserts Eat fruit, low-fat yogurt, and fruit ices instead of pastries, cake, and cookies. Sherbet, angel food cake, gelatin dessert, frozen low-fat yogurt, or other frozen products that do not contain saturated fat (pure fruit juice bars, frozen ice pops) are also acceptable.  COOKING METHODS Choose those methods that use little or no fat. They include: Poaching.  Braising.  Steaming.  Grilling.  Baking.  Stir-frying.  Broiling.  Microwaving.  Foods can be cooked in a nonstick pan without added fat, or use a nonfat cooking spray in regular cookware. Limit fried foods and avoid frying in saturated fat. Add moisture to lean meats by using water, broth, cooking wines, and other nonfat or low-fat sauces along with the cooking methods mentioned above. Soups and stews should be chilled after cooking. The fat that forms on top after a few hours in the refrigerator should be skimmed off. When preparing meals, avoid using excess salt. Salt can contribute to raising blood pressure in some people.  EATING AWAY FROM HOME Order entres, potatoes, and vegetables without sauces or butter. When meat exceeds the size of a deck of cards (3 to 4 ounces), the rest can be taken  home for another meal. Choose vegetable or fruit salads and ask for low-calorie salad dressings to be served on the side. Use dressings sparingly. Limit high-fat toppings, such as bacon, crumbled eggs, cheese, sunflower seeds, and olives. Ask for heart-healthy tub margarine instead of butter.  High-Fiber Diet A high-fiber diet changes your normal diet to include more whole grains, legumes, fruits, and vegetables. Changes in the diet involve replacing refined carbohydrates with unrefined foods. The calorie level of the diet is essentially unchanged. The Dietary Reference Intake (recommended amount) for adult males is 38 grams per day. For adult females, it is 25 grams per day. Pregnant and lactating women should consume 28 grams of fiber per day. Fiber is the intact part of a plant that is not broken down during digestion. Functional fiber is fiber that has been isolated from the plant to provide a beneficial effect in the body. PURPOSE  Increase stool bulk.   Ease and regulate bowel movements.   Lower cholesterol.  INDICATIONS THAT YOU NEED MORE FIBER  Constipation and hemorrhoids.   Uncomplicated diverticulosis (intestine condition) and irritable bowel syndrome.   Weight management.   As a protective measure against hardening of the arteries (atherosclerosis), diabetes, and cancer.  GUIDELINES FOR INCREASING FIBER IN THE DIET  Start adding fiber to the diet slowly. A gradual increase of about 5 more grams (2 slices of whole-wheat bread, 2 servings of most fruits or vegetables, or 1 bowl of high-fiber cereal) per day is best. Too rapid an increase in fiber may result in constipation, flatulence, and bloating.   Drink enough water and fluids to keep your urine clear or pale yellow. Water, juice, or caffeine-free drinks are recommended. Not drinking enough fluid may cause constipation.   Eat a variety of high-fiber foods rather than one type of fiber.   Try to increase your intake of  fiber through using high-fiber foods rather than fiber pills or supplements that contain small amounts of fiber.   The goal is to change the types of food eaten. Do not supplement your present diet with high-fiber foods, but replace foods in your present diet.  INCLUDE A VARIETY OF FIBER SOURCES  Replace refined and processed grains with whole grains, canned fruits with fresh fruits, and incorporate other fiber sources. White rice, white breads, and most bakery goods contain little or no fiber.   Brown whole-grain rice, buckwheat oats, and many fruits and vegetables are all good sources of fiber. These include: broccoli, Brussels sprouts, cabbage, cauliflower, beets, sweet potatoes, white potatoes (skin on), carrots, tomatoes, eggplant, squash, berries, fresh fruits, and dried fruits.   Cereals appear to be the richest source of fiber. Cereal fiber is found in whole grains and bran. Bran is the fiber-rich outer coat of cereal grain, which is largely removed in refining. In whole-grain cereals, the bran remains. In breakfast cereals, the largest amount of fiber is found in those with "bran" in their names. The fiber content is sometimes indicated on the label.   You may need to include additional fruits and vegetables each day.   In baking, for 1 cup white flour, you may use the following substitutions:   1 cup whole-wheat flour minus 2 tablespoons.   1/2 cup white flour plus 1/2 cup whole-wheat flour.

## 2012-11-28 NOTE — Assessment & Plan Note (Signed)
SX CONTROLLED.  CONTINUE NEXIUM. OPV IN 12 MOS.

## 2012-11-28 NOTE — Assessment & Plan Note (Signed)
SX RESOLVED. 

## 2012-12-18 NOTE — Progress Notes (Signed)
Reminder in epic °

## 2013-02-20 ENCOUNTER — Telehealth: Payer: Self-pay | Admitting: Cardiovascular Disease

## 2013-02-27 ENCOUNTER — Encounter: Payer: Self-pay | Admitting: Cardiovascular Disease

## 2013-02-27 ENCOUNTER — Ambulatory Visit (INDEPENDENT_AMBULATORY_CARE_PROVIDER_SITE_OTHER): Payer: BC Managed Care – PPO | Admitting: Cardiovascular Disease

## 2013-02-27 VITALS — BP 110/80 | HR 77 | Ht 62.0 in | Wt 112.2 lb

## 2013-02-27 DIAGNOSIS — R002 Palpitations: Secondary | ICD-10-CM

## 2013-02-27 DIAGNOSIS — R079 Chest pain, unspecified: Secondary | ICD-10-CM

## 2013-02-27 NOTE — Patient Instructions (Addendum)
Your physician has recommended that you wear an event monitor. Event monitors are medical devices that record the heart's electrical activity. Doctors most often [place] these monitors to diagnose arrhythmias. Arrhythmias are problems with the speed or rhythm of the heartbeat. The monitor is a small, portable device. You can wear one while you do your normal daily activities. This is usually used to diagnose what is causing palpitations/syncope (passing out). Your physician recommends that you schedule a follow-up appointment as needed, if event monitor results are normal.

## 2013-02-27 NOTE — Progress Notes (Signed)
Patient ID: Barbara [name], female   DOB: Dec 26, 1965, 47 y.o.   MRN: [id number]     Reason for office visit Chest pain and palpitations  Mrs. Bani is a 47 year old woman referred in consultation for chest discomfort. She describes this as a burning sensation in the retrosternal area associated with "stress". 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 She works 12 hour days and feels exhausted. Sometimes when she is "very stressed" she disproves his chest discomfort. This has been going on for a couple of years. If she takes Nexium the symptoms are improved but she does not take this medication every day. She skips it when the symptoms are not present.  Independently of the chest discomfort she has palpitations. She describes her heart is suddenly going very fast and then abruptly go back to normal. This is also associated with "stress".  She has virtually no coronary risk factors. There is no family history of coronary disease and she does not have hypertension, diabetes, hyperlipidemia and has never smoked.  She denies any shortness of breath or chest pain that is brought on by exertion. She denies syncope  or near syncope. She has not had problems with lower extremity intermittent claudication but does describe puffiness of her ankles towards the end of the day. She denies focal cortical deficits.  No Known Allergies  Current Outpatient Prescriptions  Medication Sig Dispense Refill  . esomeprazole (NEXIUM) 40 MG capsule To take only when not taking aciphex.  31 capsule  11   No current facility-administered medications for this visit.    Past Medical History  Diagnosis Date  . Dyspepsia   . Hepatic hemangioma     stable on 02/2010 MRI, no further w/u needed  . 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 Esophagogastroduodenoscopy  2007    emergent, fishbone removed  from hypopharynx  . Esophagogastroduodenoscopy  2008    mild gastritis, no h.Pylori  . Esophagogastroduodenoscopy  02/2010    small hh, fundic gland polyp  . Colonoscopy  02/2010    small internal hemorrhoids, normal TI    Family History  Problem Relation Age of Onset  . Lung cancer Brother   . Cancer Father     History   Social History  . Marital Status: Married    Spouse Name: N/A    Number of Children: 1  . Years of Education: N/A   Occupational History  .      [employer]   Social History Main Topics  . Smoking status: Never Smoker   . Smokeless tobacco: Not on file  . Alcohol Use: No  . Drug Use: No  . Sexually Active: Not on file   Other Topics Concern  . Not on file   Social History Narrative   Moved from [place] in 1983.    Review of systems: The patient specifically denies any chest pain with exertion, dyspnea at rest or with exertion, orthopnea, paroxysmal nocturnal dyspnea, syncope,  focal neurological deficits, intermittent claudication, lower extremity edema, unexplained weight gain, cough, hemoptysis or wheezing. She denies major weight changes fever chills abdominal pain gas intestinal bleeding nausea vomiting diarrhea constipation hematuria dysuria polyuria polydipsia change in mood skin or hair texture allergic reactions abnormal vaginal bleeding   PHYSICAL EXAM BP 110/80  Pulse 77  Ht 5\' 2"  (1.575 m)  Wt 112 lb 3.2  oz (50.894 kg)  BMI 20.52 kg/m2  General: Alert, oriented x3, no distress, very slender Head: no evidence of trauma, PERRL, EOMI, no exophtalmos or lid lag, no myxedema, no xanthelasma; normal ears, nose and oropharynx Neck: normal jugular venous pulsations and no hepatojugular reflux; brisk carotid pulses without delay and no carotid bruits Chest: clear to auscultation, no signs of consolidation by percussion or palpation, normal fremitus, symmetrical and full respiratory excursions Cardiovascular: normal position and quality of the  apical impulse, regular rhythm, normal first and second heart sounds, no murmurs, rubs or gallops Abdomen: no tenderness or distention, no masses by palpation, no abnormal pulsatility or arterial bruits, normal bowel sounds, no hepatosplenomegaly Extremities: no clubbing, cyanosis or edema; 2+ radial, ulnar and brachial pulses bilaterally; 2+ right femoral, posterior tibial and dorsalis pedis pulses; 2+ left femoral, posterior tibial and dorsalis pedis pulses; no subclavian or femoral bruits Neurological: grossly nonfocal   EKG:  normal sinus rhythm, normal EKG  BMET    Component Value Date/Time   CREATININE 0.59 09/08/2010 0735   GFRNONAA >60 09/08/2010 0735   GFRAA  Value: >60        The eGFR has been calculated using the MDRD equation. This calculation has not been validated in all clinical situations. eGFR's persistently <60 mL/min signify possible Chronic Kidney Disease. 09/08/2010 0735     ASSESSMENT AND PLAN  Mrs. Omaira has symptoms compatible gastroesophageal reflux disease and not suggestive of coronary insufficiency. I recommend that she take her proton pump inhibitor on a daily basis. She may find better immediate relief with an H2 histamine blocker or antacid.  On the other hand her palpitations which represents an arrhythmia since they have such abrupt onset and termination. Have recommended that she wear a 2 week event monitor.  We will bring her back for followup appointment if arrhythmias found. The suspicion for structural heart disease is low.  Junious Silk, MD, [facility] [phone] office [phone] pager

## 2013-04-20 ENCOUNTER — Encounter: Payer: Self-pay | Admitting: Cardiovascular Disease

## 2014-01-15 NOTE — Telephone Encounter (Signed)
Encounter has been closed--TP 01/15/14 

## 2014-05-01 ENCOUNTER — Ambulatory Visit (HOSPITAL_COMMUNITY)
Admission: RE | Admit: 2014-05-01 | Discharge: 2014-05-01 | Disposition: A | Discharge status: Home / Self Care | Payer: BC Managed Care – PPO | Source: Ambulatory Visit | Attending: Family Medicine | Admitting: Family Medicine

## 2014-05-01 ENCOUNTER — Other Ambulatory Visit (HOSPITAL_COMMUNITY): Payer: Self-pay | Admitting: Family Medicine

## 2014-05-01 DIAGNOSIS — M542 Cervicalgia: Secondary | ICD-10-CM | POA: Insufficient documentation

## 2014-05-01 DIAGNOSIS — IMO0001 Reserved for inherently not codable concepts without codable children: Secondary | ICD-10-CM

## 2014-05-26 ENCOUNTER — Other Ambulatory Visit: Payer: Self-pay | Admitting: Gastroenterology

## 2014-06-03 ENCOUNTER — Other Ambulatory Visit (HOSPITAL_COMMUNITY): Payer: Self-pay | Admitting: Family Medicine

## 2014-06-03 DIAGNOSIS — Z139 Encounter for screening, unspecified: Secondary | ICD-10-CM

## 2014-06-09 ENCOUNTER — Ambulatory Visit (HOSPITAL_COMMUNITY)
Admission: RE | Admit: 2014-06-09 | Discharge: 2014-06-09 | Disposition: A | Discharge status: Home / Self Care | Payer: BC Managed Care – PPO | Source: Ambulatory Visit | Attending: Family Medicine | Admitting: Family Medicine

## 2014-06-09 DIAGNOSIS — Z1231 Encounter for screening mammogram for malignant neoplasm of breast: Secondary | ICD-10-CM | POA: Insufficient documentation

## 2014-06-09 DIAGNOSIS — Z139 Encounter for screening, unspecified: Secondary | ICD-10-CM

## 2014-11-06 ENCOUNTER — Ambulatory Visit: Payer: Self-pay | Admitting: Gastroenterology

## 2015-04-09 ENCOUNTER — Encounter: Payer: Self-pay | Admitting: Gastroenterology

## 2015-04-09 ENCOUNTER — Ambulatory Visit (INDEPENDENT_AMBULATORY_CARE_PROVIDER_SITE_OTHER): Payer: BLUE CROSS/BLUE SHIELD | Admitting: Gastroenterology

## 2015-04-09 VITALS — BP 112/78 | HR 74 | Temp 97.6°F | Ht 62.0 in | Wt 114.6 lb

## 2015-04-09 DIAGNOSIS — K219 Gastro-esophageal reflux disease without esophagitis: Secondary | ICD-10-CM

## 2015-04-09 DIAGNOSIS — K146 Glossodynia: Secondary | ICD-10-CM | POA: Diagnosis not present

## 2015-04-09 DIAGNOSIS — K921 Melena: Secondary | ICD-10-CM

## 2015-04-09 DIAGNOSIS — K2990 Gastroduodenitis, unspecified, without bleeding: Secondary | ICD-10-CM

## 2015-04-09 DIAGNOSIS — K2970 Gastritis, unspecified, without bleeding: Secondary | ICD-10-CM | POA: Diagnosis not present

## 2015-04-09 MED ORDER — OMEPRAZOLE 20 MG PO CPDR: DELAYED_RELEASE_CAPSULE | ORAL | Status: AC

## 2015-04-09 NOTE — Progress Notes (Signed)
   Subjective:    Patient ID: Barbara [name], female    DOB: Jun 24, 1966, 49 y.o.   MRN: [id number]  Barbara [name]  HPI FEELS OK. MOUTH IS OK BUT MORNING HAS REFLUX/REGURGITATION WHEN SHE WAKES UP AND TONGUE LOOKS WHITE. NO TAKING NEXIUM FOR > 1 YEAR. NO TROUBLE DURING THE DAY. HAS BCBS $700 A MONTH. [employer]OLD [employer]. HUSBAND CAN'T WORK. BMs: HARD STOOLS SOMETIMES AND MAY SEE SOME BLOOD WHEN SHE WIPES. [place] BM 1X/DAY. 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  PT DENIES FEVER, CHILLS, HEMATOCHEZIA, nausea, vomiting, melena, diarrhea, CHEST PAIN, SHORTNESS OF BREATH, abdominal pain, problems swallowing, OR  heartburn or indigestion.  Past Medical History  Diagnosis Date  . Dyspepsia   . Hepatic hemangioma     stable on 02/2010 MRI, no further w/u needed  . 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 Esophagogastroduodenoscopy  2007    emergent, fishbone removed from hypopharynx  . Esophagogastroduodenoscopy  2008    mild gastritis, no h.Pylori  . Esophagogastroduodenoscopy  02/2010    small hh, fundic gland polyp  . Colonoscopy  02/2010    small internal hemorrhoids, normal TI   No Known Allergies  Current Outpatient Prescriptions  Medication Sig Dispense Refill  .       Family History  Problem Relation Age of Onset  . Lung cancer Brother   . Cancer Father    History  Substance Use Topics  . Smoking status: Never Smoker   . Smokeless tobacco: Not on file  . Alcohol Use: No   Review of Systems PER HPI OTHERWISE ALL SYSTEMS ARE NEGATIVE.     Objective:   Physical Exam  Constitutional: She is oriented to person, place, and time. She appears well-developed and well-nourished. No distress.  HENT:  Head: Normocephalic and atraumatic.  Mouth/Throat: Oropharynx is clear and moist. No oropharyngeal exudate.  Eyes: Pupils are equal, round, and reactive to light. No scleral icterus.  Neck:  Normal range of motion. Neck supple.  Cardiovascular: Normal rate, regular rhythm and normal heart sounds.   Pulmonary/Chest: Effort normal and breath sounds normal. No respiratory distress.  Abdominal: Soft. Bowel sounds are normal. She exhibits no distension. There is no tenderness.  Musculoskeletal: She exhibits no edema.  Lymphadenopathy:    She has no cervical adenopathy.  Neurological: She is alert and oriented to person, place, and time.  Psychiatric: She has a normal mood and affect.  Vitals reviewed.     Assessment & Plan:

## 2015-04-09 NOTE — Assessment & Plan Note (Signed)
SYMPTOMS FAIRLY WELL CONTROLLED off meds.  START OMEPRAZOLE QHS LOW FAT DIET GERD PRECAUTIONS FOLLOW UP IN 1 YEAR.

## 2015-04-09 NOTE — Assessment & Plan Note (Signed)
MOST LIKELY DUE TO REGURGITATION  START OMEPRAZOLE QHS LOW FAT DIET GERD PRECAUTIONS FOLLOW UP IN 1 YEAR.

## 2015-04-09 NOTE — Assessment & Plan Note (Signed)
SYMPTOMS FAIRLY WELL CONTROLLED OFF MEDS.  CONTINUE TO MONITOR SYMPTOMS.

## 2015-04-09 NOTE — Assessment & Plan Note (Signed)
SMALL VOLUME: RARE.  CONTINUE TO MONITOR SYMPTOMS.

## 2015-04-09 NOTE — Patient Instructions (Signed)
FOLLOW A LOW FAT DIET. MEATS SHOULD BE SAUTEED, BAKED, BROILED, OR BOILED.   USE OMEPRAZOLE.  TAKE 30 MINUTES PRIOR TO BEDTIME.  FOLLOW REFLUX/REGURGITATION PRECAUTIONS. AVOID TRIGGERS FOR REFLUX. SEE INFO BELOW.  FOLLOW UP IN 1 YEAR.    Lifestyle and home remedies You may eliminate or reduce the frequency of heartburn by making the following lifestyle changes:  . Control your weight. Being overweight is a major risk factor for heartburn and GERD. Excess pounds put pressure on your abdomen, pushing up your stomach and causing acid to back up into your esophagus.   . Eat smaller meals. 4 TO 6 MEALS A DAY. This reduces pressure on the lower esophageal sphincter, helping to prevent the valve from opening and acid from washing back into your esophagus.   Dolphus Jenny your belt. Clothes that fit tightly around your waist put pressure on your abdomen and the lower esophageal sphincter.   . Eliminate heartburn triggers. Everyone has specific triggers. Common triggers such as fatty or fried foods, spicy food, tomato sauce, carbonated beverages, alcohol, chocolate, mint, garlic, onion, caffeine and nicotine may make heartburn worse.   Marland Kitchen Avoid stooping or bending. Tying your shoes is OK. Bending over for longer periods to weed your garden isn't, especially soon after eating.   . Don't lie down after a meal. Wait at least three to four hours after eating before going to bed, and don't lie down right after eating.   Alternative medicine . Several home remedies exist for treating GERD, but they provide only temporary relief. They include drinking baking soda (sodium bicarbonate) added to water or drinking other fluids such as baking soda mixed with cream of tartar and water. . Although these liquids create temporary relief by neutralizing, washing away or buffering acids, eventually they aggravate the situation by adding gas and fluid to your stomach, increasing pressure and causing more acid reflux.  Further, adding more sodium to your diet may increase your blood pressure and add stress to your heart, and excessive bicarbonate ingestion can alter the acid-base balance in your body.

## 2015-04-10 NOTE — Progress Notes (Signed)
cc'ed to pcp °

## 2015-08-06 ENCOUNTER — Encounter: Payer: Self-pay | Admitting: Obstetrics and Gynecology

## 2015-08-06 ENCOUNTER — Other Ambulatory Visit (HOSPITAL_COMMUNITY)
Admission: RE | Admit: 2015-08-06 | Discharge: 2015-08-06 | Disposition: A | Discharge status: Home / Self Care | Payer: BLUE CROSS/BLUE SHIELD | Source: Ambulatory Visit | Attending: Obstetrics and Gynecology | Admitting: Obstetrics and Gynecology

## 2015-08-06 ENCOUNTER — Ambulatory Visit (INDEPENDENT_AMBULATORY_CARE_PROVIDER_SITE_OTHER): Payer: BLUE CROSS/BLUE SHIELD | Admitting: Obstetrics and Gynecology

## 2015-08-06 VITALS — BP 122/80 | Ht 62.0 in | Wt 110.0 lb

## 2015-08-06 DIAGNOSIS — Z1151 Encounter for screening for human papillomavirus (HPV): Secondary | ICD-10-CM | POA: Insufficient documentation

## 2015-08-06 DIAGNOSIS — Z01419 Encounter for gynecological examination (general) (routine) without abnormal findings: Secondary | ICD-10-CM | POA: Diagnosis not present

## 2015-08-06 NOTE — Progress Notes (Signed)
  Assessment:  Annual Gyn Exam   Plan:  1. pap smear done, next pap due 3 years 2. return annually or prn 3    Annual mammogram advised Subjective:  Barbara [name] is a 49 y.o. female No obstetric history on file. who presents for annual exam. Patient's last menstrual period was 08/01/2015. The patient has no complaints today. She states she is moving to [place] this month.  The following portions of the patient's history were reviewed and updated as appropriate: allergies, current medications, past family history, past medical history, past social history, past surgical history and problem list. Past Medical History  Diagnosis Date  . Dyspepsia   . Hepatic hemangioma     stable on 02/2010 MRI, no further w/u needed  . 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 Esophagogastroduodenoscopy  2007    emergent, fishbone removed from hypopharynx  . Esophagogastroduodenoscopy  2008    mild gastritis, no h.Pylori  . Esophagogastroduodenoscopy  02/2010    small hh, fundic gland polyp  . Colonoscopy  02/2010    small internal hemorrhoids, normal TI     Current outpatient prescriptions:  .  omeprazole (PRILOSEC) 20 MG capsule, 1 PO QHS TO PREVENT MOUTH BURNING AND REGURGITATION, Disp: 30 capsule, Rfl: 11  Review of Systems Constitutional: negative Gastrointestinal: negative Genitourinary: negative  Objective:  BP 122/80 mmHg  Ht 5\' 2"  (1.575 m)  Wt 110 lb (49.896 kg)  BMI 20.11 kg/m2  LMP 08/01/2015   BMI: Body mass index is 20.11 kg/(m^2).  General Appearance: Alert, appropriate appearance for age. No acute distress HEENT: Grossly normal Back: No CVAT Breast Exam: No masses or nodes.No dimpling, nipple retraction or discharge. Gastrointestinal: Soft, non-tender, no masses or organomegaly Pelvic Exam: Vulva and vagina appear normal. Bimanual exam reveals normal uterus and adnexa. External genitalia:  normal general appearance Vaginal: normal mucosa without prolapse or lesions and normal without tenderness, induration or masses Cervix: normal appearance Rectovaginal: normal rectal, no masses and guaiac negative stool obtained Lymphatic Exam: Non-palpable nodes in axillary or inguinal regions  Skin: no rash or abnormalities Neurologic: Normal gait and speech, no tremor  Psychiatric: Alert and oriented, appropriate affect.  Urinalysis:Not done  Mallory Shirk. MD Pgr [phone] 11:49 AM  By signing my name below, I, Erling Conte, attest that this documentation has been prepared under the direction and in the presence of Jonnie Kind, MD. Electronically Signed: Erling Conte, ED Scribe. 08/06/2015. 11:50 AM.  I personally performed the services described in this documentation, which was SCRIBED in my presence. The recorded information has been reviewed and considered accurate. It has been edited as necessary during review. Jonnie Kind, MD

## 2015-08-06 NOTE — Progress Notes (Signed)
Patient ID: Barbara [name], female   DOB: 15-Aug-1966, 49 y.o.   MRN: [id number] Pt here today for her annual exam. Pt denies any problems or concerns at this time.

## 2015-08-07 LAB — COMPREHENSIVE METABOLIC PANEL
A/G RATIO: 1.7 (ref 1.1–2.5)
ALBUMIN: 4.8 g/dL (ref 3.5–5.5)
ALT: 14 IU/L (ref 0–32)
AST: 12 IU/L (ref 0–40)
Alkaline Phosphatase: 57 IU/L (ref 39–117)
BUN/Creatinine Ratio: 20 (ref 9–23)
BUN: 11 mg/dL (ref 6–24)
Bilirubin Total: 0.9 mg/dL (ref 0.0–1.2)
CALCIUM: 9.5 mg/dL (ref 8.7–10.2)
CO2: 24 mmol/L (ref 18–29)
Chloride: 98 mmol/L (ref 97–106)
Creatinine, Ser: 0.55 mg/dL — ABNORMAL LOW (ref 0.57–1.00)
GFR, EST AFRICAN AMERICAN: 126 mL/min/{1.73_m2} (ref >59)
GFR, EST NON AFRICAN AMERICAN: 109 mL/min/{1.73_m2} (ref >59)
GLOBULIN, TOTAL: 2.8 g/dL (ref 1.5–4.5)
Glucose: 81 mg/dL (ref 65–99)
Potassium: 4.7 mmol/L (ref 3.5–5.2)
SODIUM: 141 mmol/L (ref 136–144)
TOTAL PROTEIN: 7.6 g/dL (ref 6.0–8.5)

## 2015-08-07 LAB — CYTOLOGY - PAP

## 2015-08-07 LAB — CBC
HEMOGLOBIN: 15.6 g/dL (ref 11.1–15.9)
Hematocrit: 45.7 % (ref 34.0–46.6)
MCH: 31.1 pg (ref 26.6–33.0)
MCHC: 34.1 g/dL (ref 31.5–35.7)
MCV: 91 fL (ref 79–97)
Platelets: 244 10*3/uL (ref 150–379)
RBC: 5.01 x10E6/uL (ref 3.77–5.28)
RDW: 13.1 % (ref 12.3–15.4)
WBC: 5.1 10*3/uL (ref 3.4–10.8)

## 2015-08-07 LAB — LIPID PANEL
CHOL/HDL RATIO: 3 ratio (ref 0.0–4.4)
Cholesterol, Total: 264 mg/dL — ABNORMAL HIGH (ref 100–199)
HDL: 87 mg/dL (ref >39)
LDL CALC: 151 mg/dL — AB (ref 0–99)
Triglycerides: 131 mg/dL (ref 0–149)
VLDL CHOLESTEROL CAL: 26 mg/dL (ref 5–40)

## 2015-08-07 LAB — TSH: TSH: 1.14 u[IU]/mL (ref 0.450–4.500)

## 2015-08-27 IMAGING — CR DG CERVICAL SPINE 2 OR 3 VIEWS
4 series · 4 of 4 positions shown · non-contrast
Comparison: None.

CLINICAL DATA: Neck pain

EXAM:
CERVICAL SPINE - 2-3 VIEW

[view not recorded (1 of 4)]
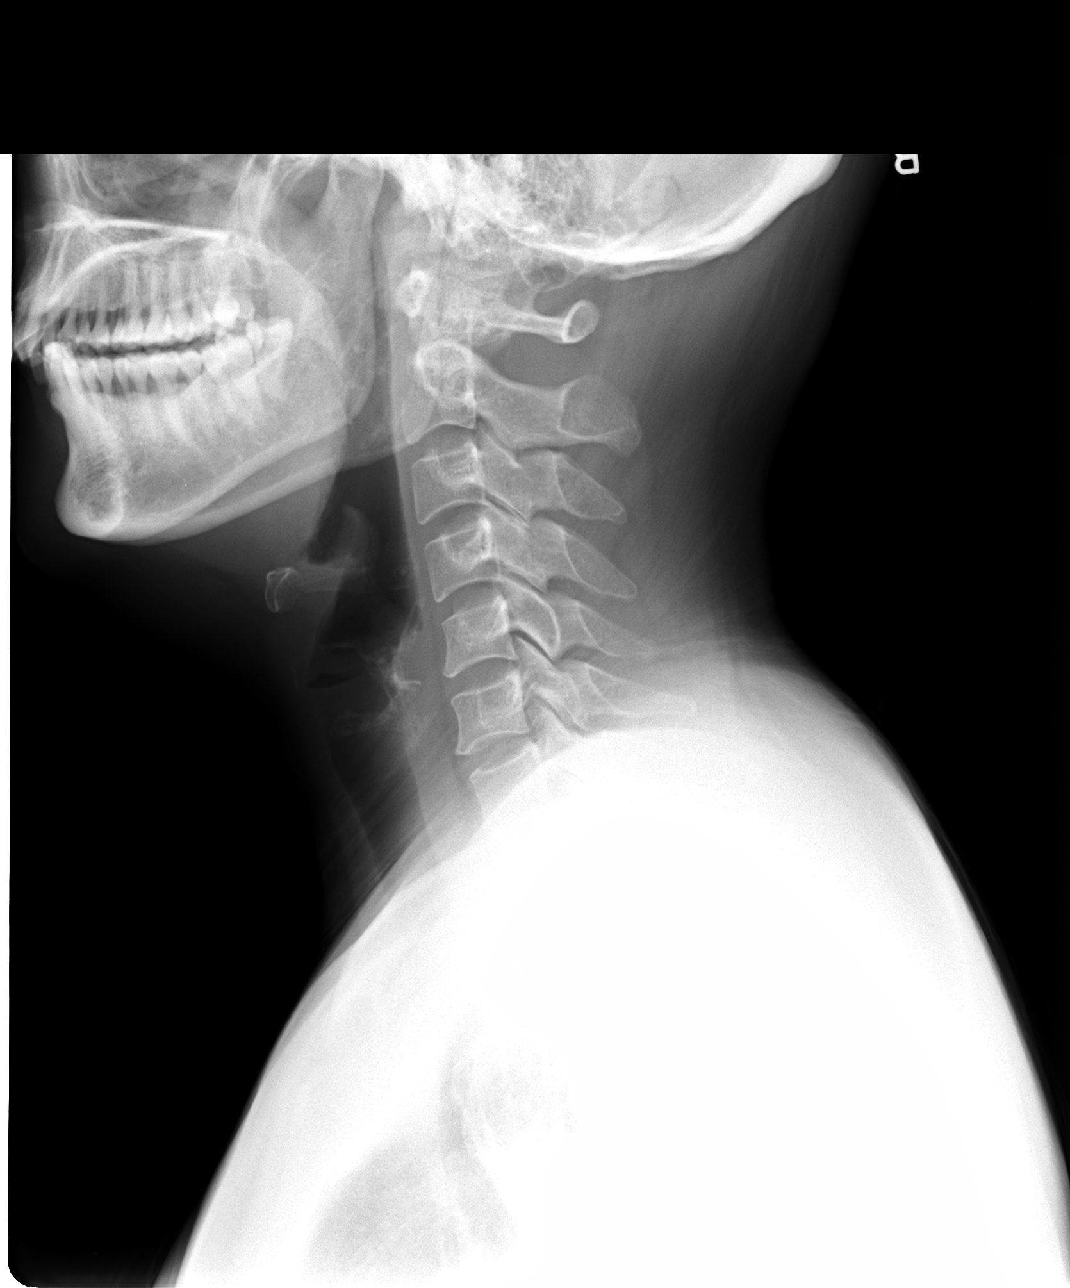

[view not recorded (2 of 4)]
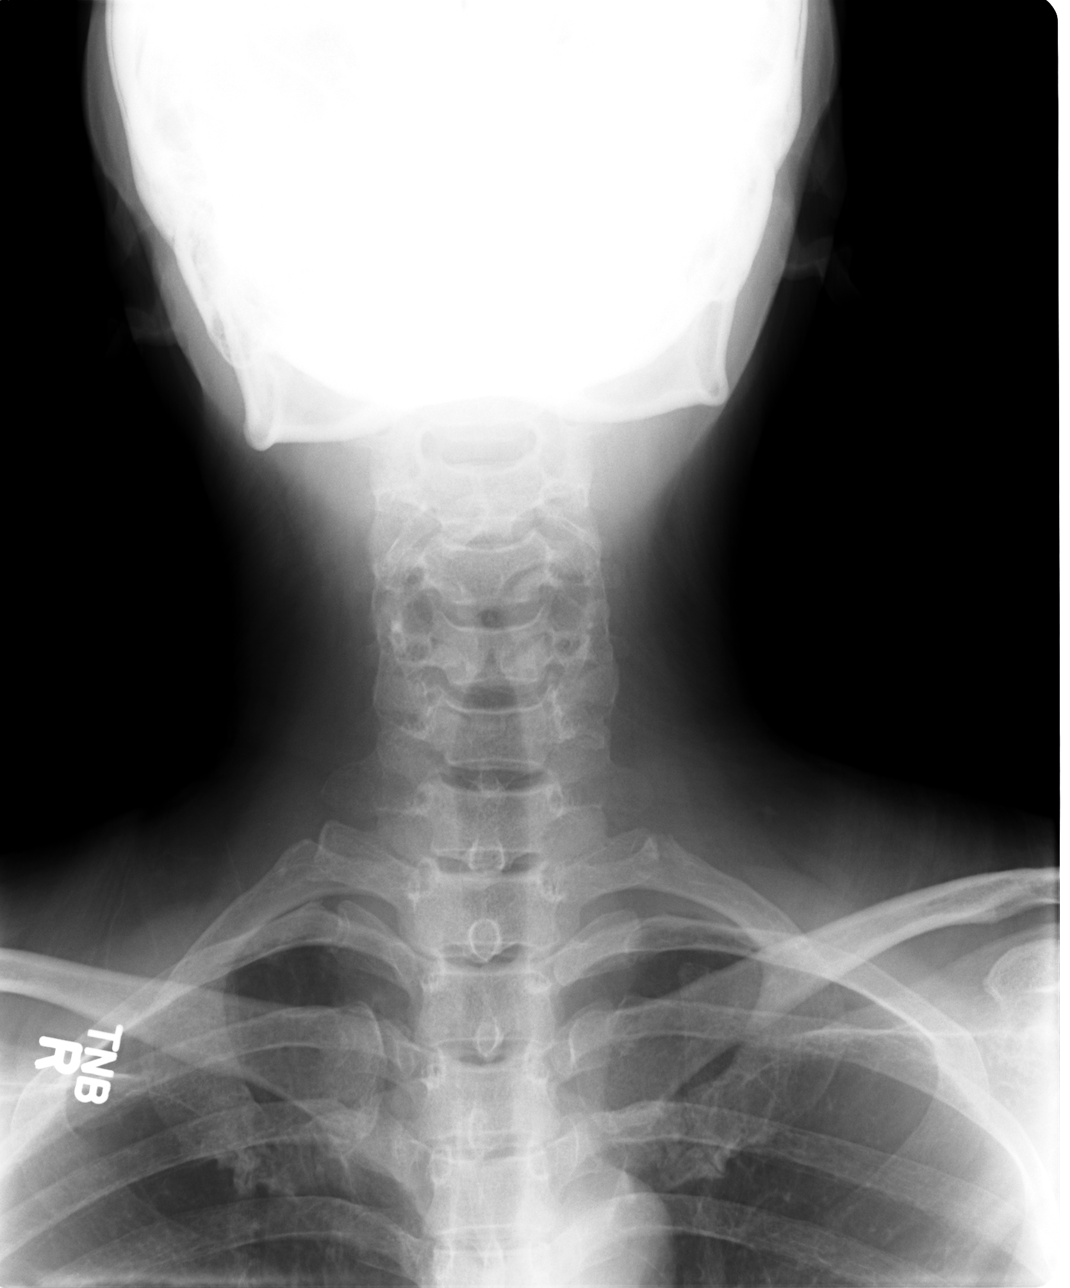

[view not recorded (3 of 4)]
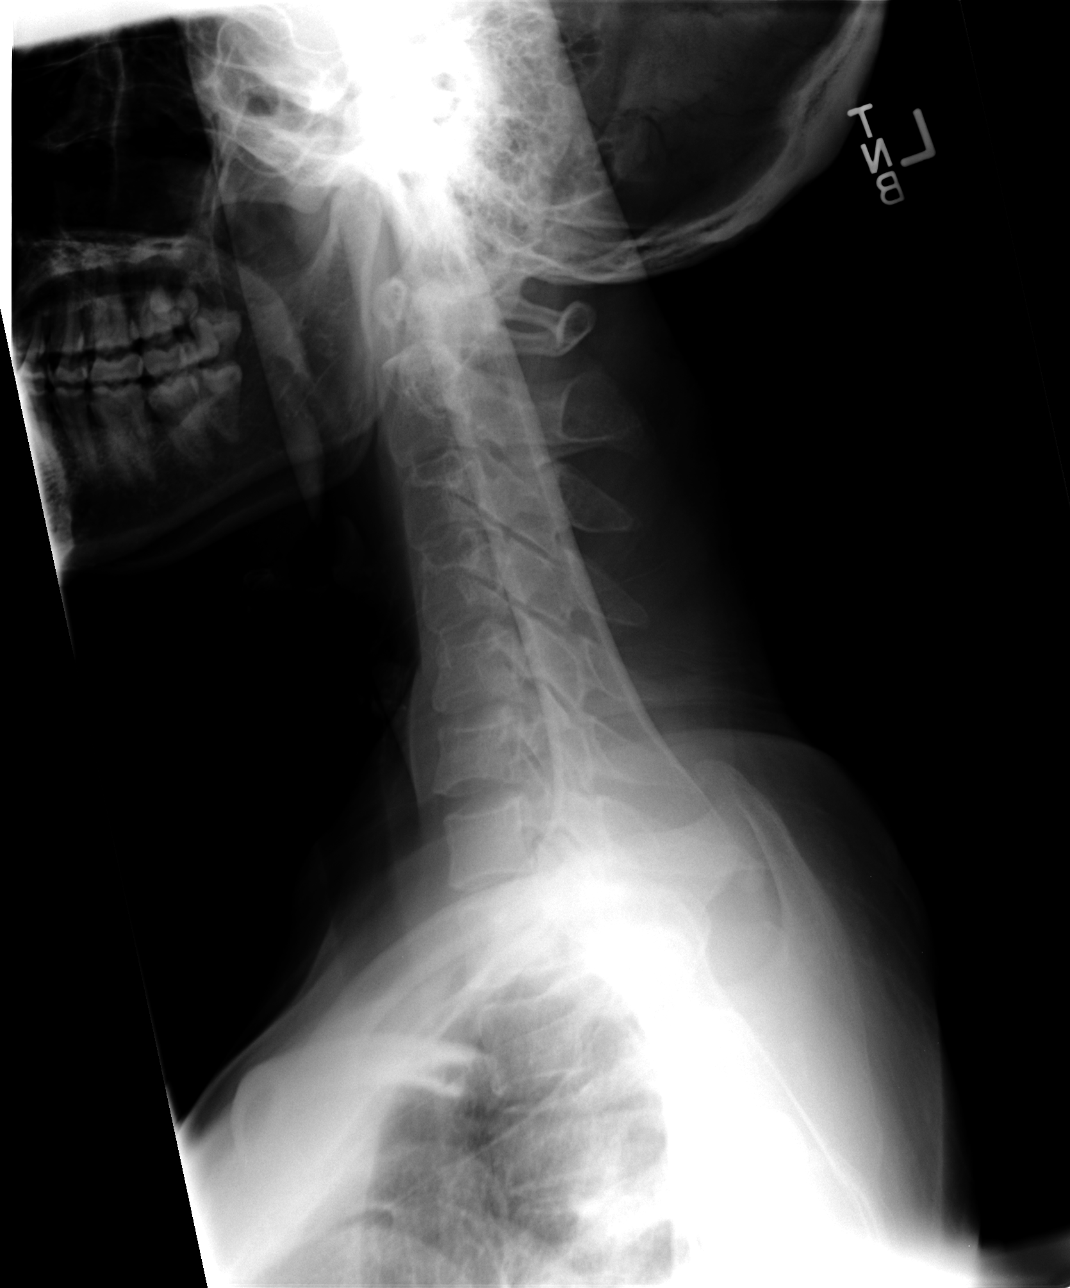

[view not recorded (4 of 4)]
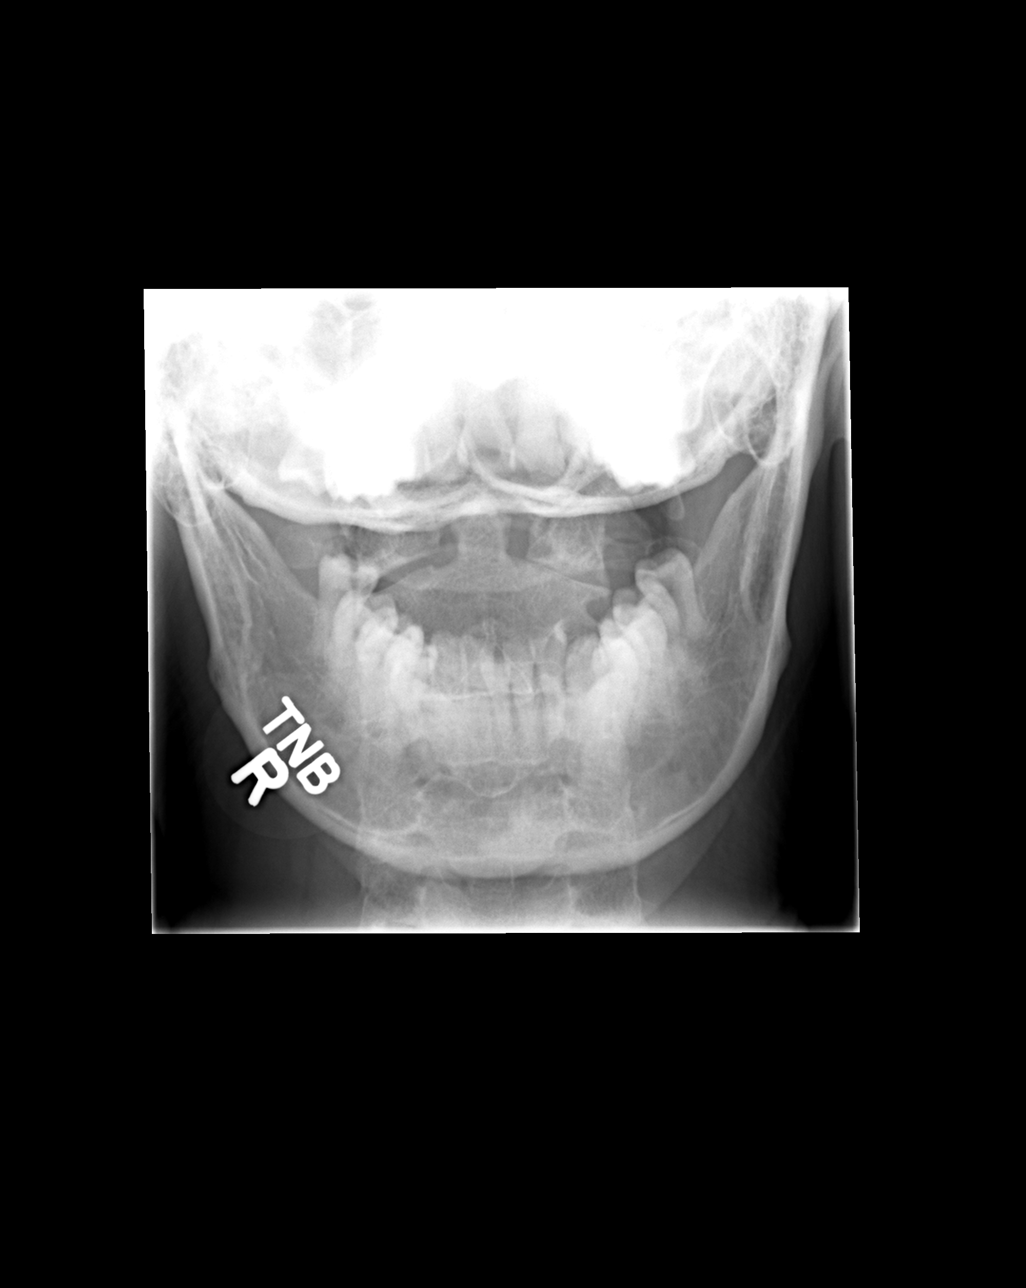

[4 of 4 positions shown; findings below may reference images not displayed]

FINDINGS: Seven cervical segments are well visualized. Vertebral body height
is well maintained. No acute fracture or acute facet abnormality is
noted. The odontoid is within normal limits.
IMPRESSION: No acute abnormality seen.

## 2016-03-03 ENCOUNTER — Encounter: Payer: Self-pay | Admitting: Gastroenterology

## 2017-06-28 ENCOUNTER — Ambulatory Visit: Admit: 2017-06-28 | Discharge: 2017-06-28 | Payer: PRIVATE HEALTH INSURANCE | Attending: Family

## 2017-06-28 DIAGNOSIS — R928 Other abnormal and inconclusive findings on diagnostic imaging of breast: Secondary | ICD-10-CM

## 2017-06-28 NOTE — H&P (Signed)
Thank-you for referring Debbie Blake to the [facility] for evaluation of right calcifications and left breast mass.  This is an independent visit      HISTORY OF PRESENT ILLNESS:  51 year old female who had a screening mammogram 04/2017 which showed right breast amorphus calcifications and left breast 0.3 cm mass. 05/17/2017 she had a left breast cyst aspiration and attempt at stereo right breast which showed calcs with fcc. The outside radiologist felt they had not sampled the most suspicious calcs..The pt is here for second opinion.  Pt reports no family history breast cancer.  There is no problem list on file for this patient.      No past medical history on file.    No past surgical history on file.     Allergies/Contraindications  Not on File    No current outpatient prescriptions on file.     No current facility-administered medications for this visit.        There are no discontinued medications.      Social History     Social History    Marital status: Married     Spouse name: N/A    Number of children: N/A    Years of education: N/A     Occupational History    Not on file.     Social History Main Topics    Smoking status: Not on file    Smokeless tobacco: Not on file    Alcohol use Not on file    Drug use: Unknown    Sexual activity: Not on file     Other Topics Concern    Not on file     Social History Narrative    No narrative on file       No family history on file.    ROSpt notes no change in breasts.    Physical Exam   Pulmonary/Chest: Right breast exhibits no inverted nipple, no mass, no nipple discharge, no skin change and no tenderness. Left breast exhibits no inverted nipple, no mass, no nipple discharge, no skin change and no tenderness.   Lymphadenopathy:     She has no cervical adenopathy.     She has no axillary adenopathy.        Right: No supraclavicular adenopathy present.        Left: No supraclavicular adenopathy present.        Studies: we reviewed  outside images. We agree with the 6 month left breast re imaging recommendation. We need her older imaging to assess the right breast calcs. We ordered these images and will re assess when we receive them.       Assessment and Plan:  Review older mammograms and follow up pending that review.

## 2017-06-28 NOTE — Progress Notes (Signed)
An interpreter was utilized for this encounter.  Name of the Interpreter: unknown  Relationship: Video Interpreter  Language: Chinese

## 2018-01-15 NOTE — Telephone Encounter (Signed)
Patient called to request mammogram appointment. Please put in order. Thanks.

## 2018-01-17 NOTE — Telephone Encounter (Signed)
Spoke with patient. Mammogram and follow up appointment scheduled on 03/23/2018 with Eder.

## 2018-03-23 ENCOUNTER — Ambulatory Visit: Admit: 2018-03-23 | Discharge: 2018-03-23 | Payer: PRIVATE HEALTH INSURANCE | Attending: Family

## 2018-03-23 ENCOUNTER — Ambulatory Visit: Admit: 2018-03-23 | Discharge: 2018-03-24 | Payer: PRIVATE HEALTH INSURANCE

## 2018-03-23 ENCOUNTER — Ambulatory Visit: Admit: 2018-03-23 | Discharge: 2018-03-23 | Payer: PRIVATE HEALTH INSURANCE

## 2018-03-23 DIAGNOSIS — N6002 Solitary cyst of left breast: Secondary | ICD-10-CM

## 2018-03-23 DIAGNOSIS — R928 Other abnormal and inconclusive findings on diagnostic imaging of breast: Secondary | ICD-10-CM

## 2018-03-23 DIAGNOSIS — Z1231 Encounter for screening mammogram for malignant neoplasm of breast: Secondary | ICD-10-CM

## 2018-03-23 NOTE — H&P (Signed)
Thank-you for referring Debbie Blake to the [facility] for evaluation of abnormal mammogram.  This is an independent visit      HISTORY OF PRESENT ILLNESS:  52 year old female who had a screening mammogram 04/2017 which showed right breast amorphus calcifications and left breast 0.3 cm mass. 05/17/2017 she had a left breast cyst aspiration and attempt at stereo right breast which showed calcs with fcc. The outside radiologist felt they had not sampled the most suspicious calcs. Pt wanted to do follow up imaging and is here today to review the imaging.    There is no problem list on file for this patient.      No past medical history on file.    No past surgical history on file.     Allergies/Contraindications  No Known Allergies    No current outpatient medications on file.     No current facility-administered medications for this visit.        There are no discontinued medications.      Social History     Socioeconomic History    Marital status: Married     Spouse name: Not on file    Number of children: Not on file    Years of education: Not on file    Highest education level: Not on file   Occupational History    Not on file   Social Needs    Financial resource strain: Not on file    Food insecurity:     Worry: Not on file     Inability: Not on file    Transportation needs:     Medical: Not on file     Non-medical: Not on file   Tobacco Use    Smoking status: Never Smoker   Substance and Sexual Activity    Alcohol use: Not on file    Drug use: Not on file    Sexual activity: Not on file   Lifestyle    Physical activity:     Days per week: Not on file     Minutes per session: Not on file    Stress: Not on file   Relationships    Social connections:     Talks on phone: Not on file     Gets together: Not on file     Attends religious service: Not on file     Active member of club or organization: Not on file     Attends meetings of clubs or organizations: Not on file      Relationship status: Not on file    Intimate partner violence:     Fear of current or ex partner: Not on file     Emotionally abused: Not on file     Physically abused: Not on file     Forced sexual activity: Not on file   Other Topics Concern    Not on file   Social History Narrative    Not on file       No family history on file.    ROSpt notes no change in breasts    Physical Exam   Pulmonary/Chest: Right breast exhibits no inverted nipple, no mass, no nipple discharge, no skin change and no tenderness. Left breast exhibits no inverted nipple, no mass, no nipple discharge, no skin change and no tenderness.   Lymphadenopathy:     She has no cervical adenopathy.     She has no axillary adenopathy.  Right: No supraclavicular adenopathy present.        Left: No supraclavicular adenopathy present.        Studies:   Mammogram today right calcs are unchanged from prior.    Assessment and Plan:  Stable exam and unchanged imaging. I discussed options with ms [place]. We could do a breast mri or we could follow up again in 6 months with mammogram and exam. Pt prefers 6 month follow up.

## 2018-09-21 ENCOUNTER — Ambulatory Visit: Admit: 2018-09-21 | Discharge: 2018-09-22 | Payer: BLUE CROSS/BLUE SHIELD

## 2018-09-21 ENCOUNTER — Ambulatory Visit: Admit: 2018-09-21 | Discharge: 2018-09-21 | Payer: BLUE CROSS/BLUE SHIELD | Attending: Family

## 2018-09-21 ENCOUNTER — Ambulatory Visit: Admit: 2018-09-21 | Discharge: 2018-09-22 | Payer: BLUE CROSS/BLUE SHIELD | Attending: Diagnostic Radiology

## 2018-09-21 DIAGNOSIS — Z1239 Encounter for other screening for malignant neoplasm of breast: Secondary | ICD-10-CM

## 2018-09-21 DIAGNOSIS — R928 Other abnormal and inconclusive findings on diagnostic imaging of breast: Secondary | ICD-10-CM

## 2018-09-21 NOTE — Progress Notes (Signed)
An interpreter was utilized for this encounter.  Name of the Interpreter: VMI  Relationship: Video Interpreter  Language: Chinese

## 2018-09-21 NOTE — H&P (Signed)
Thank-you for referring Debbie Blake to the [facility] for evaluation of history of screening mammogram 04/2017 which showed right breast amorphus calcifications and left breast 0.3 cm mass. 05/17/2017 she had a left breast cyst aspiration and attempt at stereo right breast which showed calcs with fcc. The outside radiologist felt they had not sampled the most suspicious calcs. She is doing follow up here.  This is an independent visit      HISTORY OF PRESENT ILLNESS:  52 year old female here for follow up.    There is no problem list on file for this patient.      No past medical history on file.    No past surgical history on file.     Allergies/Contraindications  No Known Allergies    No current outpatient medications on file.     No current facility-administered medications for this visit.        There are no discontinued medications.      Social History     Socioeconomic History    Marital status: Married     Spouse name: Not on file    Number of children: Not on file    Years of education: Not on file    Highest education level: Not on file   Occupational History    Not on file   Social Needs    Financial resource strain: Not on file    Food insecurity:     Worry: Not on file     Inability: Not on file    Transportation needs:     Medical: Not on file     Non-medical: Not on file   Tobacco Use    Smoking status: Never Smoker   Substance and Sexual Activity    Alcohol use: Not on file    Drug use: Not on file    Sexual activity: Not on file   Lifestyle    Physical activity:     Days per week: Not on file     Minutes per session: Not on file    Stress: Not on file   Relationships    Social connections:     Talks on phone: Not on file     Gets together: Not on file     Attends religious service: Not on file     Active member of club or organization: Not on file     Attends meetings of clubs or organizations: Not on file     Relationship status: Not on file    Intimate  partner violence:     Fear of current or ex partner: Not on file     Emotionally abused: Not on file     Physically abused: Not on file     Forced sexual activity: Not on file   Other Topics Concern    Not on file   Social History Narrative    Not on file       No family history on file.    ROSpt notes no change in breasts    Physical Exam  Chest:      Breasts:         Right: No swelling, bleeding, inverted nipple, mass, nipple discharge, skin change or tenderness.         Left: No swelling, bleeding, inverted nipple, mass, nipple discharge, skin change or tenderness.   Lymphadenopathy:      Cervical: No cervical adenopathy.      Upper Body:  Right upper body: No supraclavicular or axillary adenopathy.      Left upper body: No supraclavicular or axillary adenopathy.          Studies:   Breast Ultrasound Limited, Left [[id number]] (Order [id number])   Imaging   Date: 09/21/2018 Department[facility]iology Mamm[facility] Released By: Lita MainsGabriel A Digilov Authorizing: Myna HidalgoSuzanne E Donnell Wion, [facility] LPCFNP   PACS Images     Show images for Breast Ultrasound Limited, Left   Study Result     Exam Date: 09/21/2018    Exam(s): Bilateral diagnostic mammography, limited left breast ultrasound    Clinical History:  52 year old woman with presenting for short-interval follow up of suspicious calcifications in the upper inner right breast which underwent unsucessful stereotactic biopsy attempt at [facility]. In addition, she has a history of an aspirated left breast cyst on ultrasound, though cytology was deemed non-diagnostic. Follow-up left breast ultrasound was recommended.    Comparison:  Prior examinations dated 03/23/2018, 03/23/2018, 05/17/2017 and 04/27/2017 were reviewed for comparison.    Mammographic Findings:  The breasts are heterogeneously dense, which may obscure small masses.  There is a group of amorphous calcifications in the upper inner right breast, far posterior depth, measuring approximately 9 mm. This finding is  stable since last study.  Biopsy clips, both breasts.  Scattered and occasionally grouped benign-appearing microcalcifications, both breasts, unchanged from previous examination(s).    Ultrasound Findings:  Ultrasound of the left breast at the site of prior cyst aspiration at 2:30 position, 0.5 cm from nipple demonstrates a [employer]rom[employer] biopsy clip, No other lesion is identified.    IMPRESSION:   Right breast: Suspicious (BI-RADS 4).  Left breast: Benign (BI-RADS 2).    Recommendations:  Suspicious calcifications in the upper inner, far posterior right breast are stable. Given prior unsucessful attempt at stereotactic biopsy, recommend surgical/clinical management. If excision is deferred, recommend short-interval follow-up mammogram of the right breast.    Findings were discussed with the patient by Dr. Anselm JunglingHo at the end of the exam.    A letter explaining this interpretation in lay language has been sent to the patient.    Radiologist  Linard MillersAmie Lee, M.D.         Assessment and Plan:  Stable exam and unchanged imaging. 6 month right mammogram and exam.

## 2018-10-08 NOTE — Telephone Encounter (Signed)
Copied from CRM [id number]. Topic: CC - Advice  >> Oct 08, 2018 10:55 AM Rodena Goldmann wrote:  GENERAL TEMPLATE    PATIENT NAME: Debbie Blake  DATE OF BIRTH: [birth year]  MRN: [id number]    HOME PHONE NUMBER:        ALTERNATE PHONE NUMBER:     [phone] - call with Cantonese interpreter  LEAVING A MESSAGE OK?:    yes    INSURANCE INFORMATION:  PAYOR: [employer]   PLAN: Bx/bs Out Of [facility] Card Program    MESSAGE / PROBLEM:    pt called (used cantonese interpreter Johnathan [phone]). Pt states she had a mammo on 12/27 and was told that her left breast is fine and there's nothing wrong with it. Pt states she received a mail that says that there is something wrong with her left side and she would need a f/u mammo. Pt would like to confirm if she would need that f/u mammo. Please assist, thank you      MESSAGE GENERATED BY [facility]  CRM NUMBER [id number] CREATED BY:    HO, QUYNH,     10/08/2018,      10:55 AUsing Cantonese interpreter [name] [phone] LVM with office call back # letting her know she has a follow up Mammogram and clinic visit scheduled 03/27/19 at 1:30 pm and 2:30 pm

## 2018-10-08 NOTE — Telephone Encounter (Signed)
Ms. Multani called back to report she has a letter from [place] with an [place] report stating she has an abnormality in the left breast that requires follow up.  Asked her to please send letter as an attachment to her MyChart for review.  She has a Mammo scheduled on 03/27/19 and [facility] same day.  She agrees to attempt to send letter as attachment an noted the follow up visit date and time.

## 2019-03-27 ENCOUNTER — Ambulatory Visit: Admit: 2019-03-27 | Discharge: 2019-03-27 | Payer: BLUE CROSS/BLUE SHIELD | Attending: Family

## 2019-03-27 ENCOUNTER — Ambulatory Visit: Admit: 2019-03-27 | Discharge: 2019-03-28 | Payer: BLUE CROSS/BLUE SHIELD

## 2019-03-27 DIAGNOSIS — R928 Other abnormal and inconclusive findings on diagnostic imaging of breast: Secondary | ICD-10-CM

## 2019-03-27 NOTE — Progress Notes (Signed)
This is an independent service.  The available consultant for this service is Vilinda Blanks, MD.          Subjective    Debbie Blake is a 53 y.o. female who presents with the following:   history of screening mammogram 04/2017 which showed right breast amorphus calcifications and left breast 0.3 cm mass. 05/17/2017 she had a left breast cyst aspiration and attempt at stereo right breast which showed calcs with fcc. The outside radiologist felt they had not sampled the most suspicious calcs. She is doing follow up here.  Chief Complaint   Patient presents with    Abnormal mammogram    Follow-up       History of Present Illness   53 year old female here for follow up.      I reviewed the patient's pertinent allergies, medications  in the electronic health record and made updates as appropriate.    Review of Systems  Pt notes no change in breasts  All other systems were reviewed and are negative.        Objective      Vitals    03/27/19 1540   BP: 149/86   Pulse: 67   Resp: 16   Temp: 36.3 C (97.3 F)   TempSrc: Temporal   SpO2: 100%   Weight: 54.7 kg (120 lb 11.2 oz)   Height: 161.3 cm (5' 3.5")   PainSc:  0     Body mass index is 21.04 kg/m.    Physical Exam  Chest:      Breasts:         Right: Normal.         Left: Normal.   Lymphadenopathy:      Cervical: No cervical adenopathy.      Upper Body:      Right upper body: No supraclavicular or axillary adenopathy.      Left upper body: No supraclavicular or axillary adenopathy.         Review of Prior Testing  Today right mammogram reviewed: no appreciable change in calcifications.     Assessment and Plan        Problem Based Assessment and Plan     Abnormal mammogram  Stable exam and imaging. 83month exam and bilateral mammogram.  -     Mammogram Diagnostic, Bilateral; Future         I spent a total of 15 minutes face-to-face with the patient and >50% of that time was spent counseling regarding the symptoms, treatment plan, risks and/or therapeutic options for the  diagnoses above.

## 2019-10-07 ENCOUNTER — Ambulatory Visit: Admit: 2019-10-07 | Discharge: 2019-10-07 | Payer: BLUE CROSS/BLUE SHIELD

## 2019-10-31 ENCOUNTER — Ambulatory Visit: Admit: 2019-10-31 | Discharge: 2019-10-31 | Payer: BLUE CROSS/BLUE SHIELD

## 2019-12-20 ENCOUNTER — Ambulatory Visit: Admit: 2019-12-20 | Payer: BLUE CROSS/BLUE SHIELD | Attending: Physician | Primary: Physician

## 2019-12-20 ENCOUNTER — Ambulatory Visit: Admit: 2019-12-20 | Discharge: 2019-12-20 | Payer: BLUE CROSS/BLUE SHIELD | Attending: Family

## 2019-12-20 DIAGNOSIS — R928 Other abnormal and inconclusive findings on diagnostic imaging of breast: Secondary | ICD-10-CM

## 2019-12-20 NOTE — Progress Notes (Signed)
This is an independent service.          Subjective    Debbie Blake is a 54 y.o. female who presents with the following:  history ofscreening mammogram 04/2017 which showed right breast amorphus calcifications and left breast 0.3 cm mass. 05/17/2017 she had a left breast cyst aspiration and attempt at stereo right breast which showed calcs with fcc. The outside radiologist felt they had not sampled the most suspicious calcs.She is doing follow up here.  Chief Complaint            Abnormal mammogram     Follow-up         Interpreter: Patient/Caregiver declined    History of Present Illness   54 year old female here for follow up. Pt notes no change in breasts.          Objective      Vitals      Most Recent Value   Weight  55.8 kg (123 lb)   Height  158.8 cm (5' 2.52") [12/20/2019 @[facility]  VF W PT -NF]   Pain Score  0   BMI (Calculated)  22.2            Physical Exam  Chest:      Breasts:         Right: No swelling, bleeding, inverted nipple, mass, nipple discharge, skin change or tenderness.         Left: No swelling, bleeding, inverted nipple, mass, nipple discharge, skin change or tenderness.   Lymphadenopathy:      Cervical: No cervical adenopathy.      Upper Body:      Right upper body: No supraclavicular or axillary adenopathy.      Left upper body: No supraclavicular or axillary adenopathy.           Review of Prior Testing  Imaging  Date: 12/20/2019 Department: [facility] Radiology Mammography Released By: Westley Chandler Authorizing: Melene Plan, CFNP   In Basket Actions    ReviewedResult NoteView in In Basket   Study Result    Exam Date: 12/20/2019    Exam(s): Bilateral diagnostic mammography    Clinical History:  54 year old woman presenting for short interval follow up of suspicious right breast calcifications for which sampling was recommended, but was unsuccessful at an [facility].    Comparison:   Prior examinations dated 03/27/2019, 09/21/2018, 03/23/2018, 05/17/2017, and 04/27/2012 were reviewed for comparison.    Mammographic Findings:  The breasts are heterogeneously dense, which may obscure small masses.  There is a group of amorphous calcifications in the right breast, upper inner quadrant, far posterior depth, measuring approximately 8 mm and essentially unchanged from priors.  Biopsy clips, both breasts.  Scattered and occasionally grouped benign-appearing microcalcifications, both breasts, unchanged from previous examination(s).    IMPRESSION:   Right breast: Suspicious (BI-RADS 4).  Left breast: Benign (BI-RADS 2).    Recommendations:  Suspicious right breast calcifications appear not significantly changed from prior. Given difficulty with positioning and far posterior location and prior failed attempt at biopsy, recommend continued surgical consultation for management. If excision is deferred recommend 6 month follow up diagnostic mammogram.    Recommend routine mammography screening in 1 year for the left breast, according to [employer] of [employer]iology guidelines.    A letter explaining this interpretation in lay language has been sent to the patient.    Radiologist  Claudius Sis, M.D.  Assessment and Plan       Abnormal mammogram  Stable exam and unchanged imaging. Yearly   -     Mammogram Diagnostic, Bilateral; Future                     I spent a total of 15 minutes on this patient's care on the day of their visit excluding time spent related to any billed procedures. This time includes face-to-face time with the patient as well as time spent documenting in the medical record, reviewing patient's records and tests, obtaining history, placing orders, communicating with other healthcare professionals, counseling the patient, family, or caregiver, and/or care coordination for the diagnoses above.

## 2020-12-18 ENCOUNTER — Inpatient Hospital Stay: Payer: PRIVATE HEALTH INSURANCE | Attending: Physician | Primary: Physician
